# Patient Record
Sex: Female | Born: 1965 | Race: White | Hispanic: No | Marital: Married | Smoking: Never smoker
Health system: Southern US, Community
[De-identification: ages and names within clinical notes are randomized; demographics above are authoritative.]

## PROBLEM LIST (undated history)

## (undated) DIAGNOSIS — F32A Depression, unspecified: Secondary | ICD-10-CM

## (undated) DIAGNOSIS — F329 Major depressive disorder, single episode, unspecified: Secondary | ICD-10-CM

## (undated) DIAGNOSIS — K219 Gastro-esophageal reflux disease without esophagitis: Secondary | ICD-10-CM

## (undated) DIAGNOSIS — N841 Polyp of cervix uteri: Secondary | ICD-10-CM

## (undated) DIAGNOSIS — Z8619 Personal history of other infectious and parasitic diseases: Secondary | ICD-10-CM

## (undated) HISTORY — DX: Gastro-esophageal reflux disease without esophagitis: K21.9

## (undated) HISTORY — DX: Polyp of cervix uteri: N84.1

## (undated) HISTORY — DX: Major depressive disorder, single episode, unspecified: F32.9

## (undated) HISTORY — DX: Depression, unspecified: F32.A

## (undated) HISTORY — DX: Personal history of other infectious and parasitic diseases: Z86.19

---

## 1999-01-16 ENCOUNTER — Inpatient Hospital Stay (HOSPITAL_COMMUNITY): Admission: AD | Admit: 1999-01-16 | Discharge: 1999-01-18 | Payer: Self-pay | Admitting: Obstetrics and Gynecology

## 1999-02-24 ENCOUNTER — Other Ambulatory Visit: Admission: RE | Admit: 1999-02-24 | Discharge: 1999-02-24 | Payer: Self-pay | Admitting: Obstetrics and Gynecology

## 2000-02-28 ENCOUNTER — Other Ambulatory Visit: Admission: RE | Admit: 2000-02-28 | Discharge: 2000-02-28 | Payer: Self-pay | Admitting: Obstetrics and Gynecology

## 2000-03-27 ENCOUNTER — Ambulatory Visit (HOSPITAL_COMMUNITY): Admission: RE | Admit: 2000-03-27 | Discharge: 2000-03-27 | Payer: Self-pay | Admitting: Obstetrics and Gynecology

## 2001-04-25 ENCOUNTER — Other Ambulatory Visit: Admission: RE | Admit: 2001-04-25 | Discharge: 2001-04-25 | Payer: Self-pay | Admitting: Obstetrics and Gynecology

## 2003-02-26 ENCOUNTER — Other Ambulatory Visit: Admission: RE | Admit: 2003-02-26 | Discharge: 2003-02-26 | Payer: Self-pay | Admitting: Obstetrics and Gynecology

## 2004-03-25 ENCOUNTER — Other Ambulatory Visit: Admission: RE | Admit: 2004-03-25 | Discharge: 2004-03-25 | Payer: Self-pay | Admitting: Obstetrics & Gynecology

## 2005-12-19 HISTORY — PX: NASAL SINUS SURGERY: SHX719

## 2006-01-18 ENCOUNTER — Other Ambulatory Visit: Admission: RE | Admit: 2006-01-18 | Discharge: 2006-01-18 | Payer: Self-pay | Admitting: Obstetrics and Gynecology

## 2006-04-28 ENCOUNTER — Ambulatory Visit: Payer: Self-pay | Admitting: Family Medicine

## 2008-12-19 LAB — HM MAMMOGRAPHY

## 2011-08-20 HISTORY — PX: CHOLECYSTECTOMY: SHX55

## 2011-09-04 ENCOUNTER — Observation Stay: Payer: Self-pay | Admitting: Surgery

## 2012-03-15 ENCOUNTER — Encounter: Payer: Self-pay | Admitting: Internal Medicine

## 2012-03-15 ENCOUNTER — Other Ambulatory Visit (HOSPITAL_COMMUNITY)
Admission: RE | Admit: 2012-03-15 | Discharge: 2012-03-15 | Disposition: A | Discharge status: Home / Self Care | Payer: BC Managed Care – PPO | Source: Ambulatory Visit | Attending: Internal Medicine | Admitting: Internal Medicine

## 2012-03-15 ENCOUNTER — Ambulatory Visit (INDEPENDENT_AMBULATORY_CARE_PROVIDER_SITE_OTHER): Payer: BC Managed Care – PPO | Admitting: Internal Medicine

## 2012-03-15 VITALS — BP 110/68 | HR 103 | Temp 98.2°F | Ht 64.0 in | Wt 160.0 lb

## 2012-03-15 DIAGNOSIS — J45909 Unspecified asthma, uncomplicated: Secondary | ICD-10-CM

## 2012-03-15 DIAGNOSIS — J329 Chronic sinusitis, unspecified: Secondary | ICD-10-CM

## 2012-03-15 DIAGNOSIS — Z01419 Encounter for gynecological examination (general) (routine) without abnormal findings: Secondary | ICD-10-CM | POA: Insufficient documentation

## 2012-03-15 DIAGNOSIS — K219 Gastro-esophageal reflux disease without esophagitis: Secondary | ICD-10-CM

## 2012-03-15 DIAGNOSIS — Z0000 Encounter for general adult medical examination without abnormal findings: Secondary | ICD-10-CM

## 2012-03-15 LAB — COMPREHENSIVE METABOLIC PANEL
Alkaline Phosphatase: 77 U/L (ref 39–117)
BUN: 9 mg/dL (ref 6–23)
CO2: 26 mEq/L (ref 19–32)
Creat: 0.69 mg/dL (ref 0.50–1.10)
Glucose, Bld: 90 mg/dL (ref 70–99)
Total Bilirubin: 0.6 mg/dL (ref 0.3–1.2)
Total Protein: 7.3 g/dL (ref 6.0–8.3)

## 2012-03-15 LAB — LIPID PANEL
Cholesterol: 222 mg/dL — ABNORMAL HIGH (ref 0–200)
HDL: 56 mg/dL (ref >39)
LDL Cholesterol: 113 mg/dL — ABNORMAL HIGH (ref 0–99)
Triglycerides: 263 mg/dL — ABNORMAL HIGH (ref <150)
VLDL: 53 mg/dL — ABNORMAL HIGH (ref 0–40)

## 2012-03-15 MED ORDER — SULFAMETHOXAZOLE-TRIMETHOPRIM 800-160 MG PO TABS: 1.0000 | ORAL_TABLET | Freq: Two times a day (BID) | ORAL | Status: AC

## 2012-03-15 MED ORDER — DEXLANSOPRAZOLE 60 MG PO CPDR: 60.0000 mg | DELAYED_RELEASE_CAPSULE | Freq: Every day | ORAL | Status: DC

## 2012-03-15 MED ORDER — ALBUTEROL SULFATE HFA 108 (90 BASE) MCG/ACT IN AERS: 2.0000 | INHALATION_SPRAY | Freq: Four times a day (QID) | RESPIRATORY_TRACT | Status: DC | PRN

## 2012-03-15 NOTE — Progress Notes (Signed)
Subjective:    Patient ID: Tanya Kramer, female    DOB: [birth year], 46 y.o.   MRN: [id number]  HPI 46 year old female with history of asthma and recurrent sinusitis presents to establish care. In regards to her asthma, she reports symptoms are well-controlled with a rare use of albuterol inhaler. She does not use any maintenance inhalers. She denies any current symptoms of cough or shortness of breath.  Regards to recurrent sinusitis, she notes that she previously had sinus surgery and had some improvement in her symptoms, however her symptoms have recently been recurrent. She has frequent episodes of sinusitis, with symptoms including facial pain, headache, purulent nasal drainage. She did complete allergy shots in the past with no improvement. She notes that she was recently treated with a course of Biaxin with no improvement. She denies any fever or chills. She currently has bilateral frontal facial pain, a year pain, and headache.  She also notes a history of GERD. She reports that she has been taking Prilosec with no improvement in her symptoms of upper epigastric pain and reflux. She notes that when she recently was on Biaxin, her symptoms seemed to improve. She has tried modifying her diet including limiting intake of spicy foods such as spaghetti, with no improvement. She denies nausea or vomiting. She denies diarrhea, constipation, blood in her stool. She does note that after having her gallbladder removed, she has urgent bowel movements after some meals.  Outpatient Encounter Prescriptions as of 03/15/2012  Medication Sig Dispense Refill  . albuterol (PROAIR HFA) 108 (90 BASE) MCG/ACT inhaler Inhale 2 puffs into the lungs every 6 (six) hours as needed.  1 Inhaler  3  . cetirizine (ZYRTEC) 10 MG tablet Take 10 mg by mouth daily as needed.      Marland Kitchen OVER THE COUNTER MEDICATION OTC Heartburn Med as needed      . DISCONTD: albuterol (PROAIR HFA) 108 (90 BASE) MCG/ACT inhaler Inhale 2 puffs into  the lungs every 6 (six) hours as needed.      Marland Kitchen dexlansoprazole (DEXILANT) 60 MG capsule Take 1 capsule (60 mg total) by mouth daily.  30 capsule  1  . sulfamethoxazole-trimethoprim (BACTRIM DS,SEPTRA DS) 800-160 MG per tablet Take 1 tablet by mouth 2 (two) times daily.  20 tablet  0    Review of Systems  Constitutional: Negative for fever, chills, appetite change, fatigue and unexpected weight change.  HENT: Positive for congestion and sinus pressure. Negative for ear pain, sore throat, trouble swallowing, neck pain and voice change.   Eyes: Negative for visual disturbance.  Respiratory: Negative for cough, shortness of breath, wheezing and stridor.   Cardiovascular: Negative for chest pain, palpitations and leg swelling.  Gastrointestinal: Positive for abdominal pain. Negative for nausea, vomiting, diarrhea, constipation, blood in stool, abdominal distention and anal bleeding.  Genitourinary: Negative for dysuria and flank pain.  Musculoskeletal: Negative for myalgias, arthralgias and gait problem.  Skin: Negative for color change and rash.  Neurological: Positive for headaches. Negative for dizziness.  Hematological: Negative for adenopathy. Does not bruise/bleed easily.  Psychiatric/Behavioral: Negative for suicidal ideas, sleep disturbance and dysphoric mood. The patient is not nervous/anxious.    BP 110/68  Pulse 103  Temp(Src) 98.2 F (36.8 C) (Oral)  Ht 5\' 4"  (1.626 m)  Wt 160 lb (72.576 kg)  BMI 27.46 kg/m2  SpO2 97%  LMP 03/05/2012     Objective:   Physical Exam  Constitutional: She is oriented to person, place, and time. She appears well-developed and  well-nourished. No distress.  HENT:  Head: Normocephalic and atraumatic.  Right Ear: External ear normal.  Left Ear: External ear normal.  Nose: Nose normal.  Mouth/Throat: Oropharynx is clear and moist. No oropharyngeal exudate.  Eyes: Conjunctivae are normal. Pupils are equal, round, and reactive to light. Right eye  exhibits no discharge. Left eye exhibits no discharge. No scleral icterus.  Neck: Normal range of motion. Neck supple. No tracheal deviation present. No thyromegaly present.  Cardiovascular: Normal rate, regular rhythm, normal heart sounds and intact distal pulses.  Exam reveals no gallop and no friction rub.   No murmur heard. Pulmonary/Chest: Effort normal and breath sounds normal. No respiratory distress. She has no wheezes. She has no rales. She exhibits no tenderness.  Abdominal: Soft. Bowel sounds are normal. She exhibits no distension and no mass. There is no tenderness. There is no rebound and no guarding.  Genitourinary: Rectum normal, vagina normal and uterus normal. No breast swelling, tenderness, discharge or bleeding. Pelvic exam was performed with patient prone. There is no rash, tenderness or lesion on the right labia. There is no rash, tenderness or lesion on the left labia. Uterus is not enlarged and not tender. Cervix exhibits no motion tenderness, no discharge and no friability. Right adnexum displays no mass, no tenderness and no fullness. Left adnexum displays no mass, no tenderness and no fullness. No erythema or tenderness around the vagina. No vaginal discharge found.  Musculoskeletal: Normal range of motion. She exhibits no edema and no tenderness.  Lymphadenopathy:    She has no cervical adenopathy.  Neurological: She is alert and oriented to person, place, and time. No cranial nerve deficit. She exhibits normal muscle tone. Coordination normal.  Skin: Skin is warm and dry. No rash noted. She is not diaphoretic. No erythema. No pallor.  Psychiatric: She has a normal mood and affect. Her behavior is normal. Judgment and thought content normal.          Assessment & Plan:

## 2012-03-15 NOTE — Assessment & Plan Note (Signed)
Symptoms poorly controlled with prilosec. Will change to dexilant. Will check for H. Pylori infection. Follow up 1 month.

## 2012-03-15 NOTE — Assessment & Plan Note (Signed)
Symptoms and exam c/w sinusitis. Will treat with augmentin. If no improvement, consider ENT referral as symptoms persistent and frequently recurrent.

## 2012-03-15 NOTE — Assessment & Plan Note (Signed)
Well controlled with prn albuterol.  Will continue.

## 2012-03-15 NOTE — Assessment & Plan Note (Signed)
Exam including breast and pelvic exam normal today. PAP pending. Mammogram ordered. Will check labs including CBC, CMP, lipids. Will request previous records.

## 2012-03-19 ENCOUNTER — Ambulatory Visit: Payer: Self-pay | Admitting: Internal Medicine

## 2012-03-23 ENCOUNTER — Telehealth: Payer: Self-pay | Admitting: *Deleted

## 2012-03-23 NOTE — Telephone Encounter (Signed)
Message copied by Vernie Murders on Fri Mar 23, 2012  5:08 PM ------      Message from: Ronna Polio A      Created: Fri Mar 23, 2012  3:43 PM       PAP was normal.

## 2012-04-12 ENCOUNTER — Encounter: Payer: Self-pay | Admitting: Internal Medicine

## 2012-04-16 ENCOUNTER — Ambulatory Visit (INDEPENDENT_AMBULATORY_CARE_PROVIDER_SITE_OTHER): Payer: BC Managed Care – PPO | Admitting: Internal Medicine

## 2012-04-16 ENCOUNTER — Encounter: Payer: Self-pay | Admitting: Internal Medicine

## 2012-04-16 VITALS — BP 100/62 | HR 68 | Temp 98.2°F | Resp 14 | Wt 159.5 lb

## 2012-04-16 DIAGNOSIS — K219 Gastro-esophageal reflux disease without esophagitis: Secondary | ICD-10-CM

## 2012-04-16 DIAGNOSIS — J329 Chronic sinusitis, unspecified: Secondary | ICD-10-CM

## 2012-04-16 DIAGNOSIS — J0100 Acute maxillary sinusitis, unspecified: Secondary | ICD-10-CM

## 2012-04-16 MED ORDER — DEXLANSOPRAZOLE 60 MG PO CPDR: 60.0000 mg | DELAYED_RELEASE_CAPSULE | Freq: Every day | ORAL | Status: DC

## 2012-04-16 MED ORDER — AMOXICILLIN-POT CLAVULANATE 875-125 MG PO TABS: 1.0000 | ORAL_TABLET | Freq: Two times a day (BID) | ORAL | Status: AC

## 2012-04-16 NOTE — Assessment & Plan Note (Signed)
Symptoms much improved with Dexilant. Will continue Dexilant. Samples and new Rx given today.

## 2012-04-16 NOTE — Assessment & Plan Note (Signed)
Recurrent. Will treat with Augmentin. Discussed using prednisone as well to help with inflammation, but pt would prefer to avoid using this medication. Will set up with ENT given that symptoms have been recurrent. Question if she may have obstruction leading to chronic infection.

## 2012-04-16 NOTE — Progress Notes (Signed)
Subjective:    Patient ID: Tanya Kramer, female    DOB: 11/26/1966, 46 y.o.   MRN: [id number]  HPI 46 year old female with history of recurrent sinusitis and GERD presents for followup. In regards to her sinus infections, she reports improvement after her last visit with the use of Bactrim. However, symptoms recurred when she stopped antibiotic. She now has had several weeks of nasal congestion, sinus pressure, headache, dry cough, and fatigue. She has tried both over-the-counter antihistamines and inhaled nasal steroids with no improvement.  In regards to her GERD, she reports significant improvement with the use of Dexilant.  Her symptoms recurred when she tried to stop this medication. Notably, her labs were negative for H. pylori.  Outpatient Encounter Prescriptions as of 04/16/2012  Medication Sig Dispense Refill  . albuterol (PROAIR HFA) 108 (90 BASE) MCG/ACT inhaler Inhale 2 puffs into the lungs every 6 (six) hours as needed.  1 Inhaler  3  . cetirizine (ZYRTEC) 10 MG tablet Take 10 mg by mouth daily as needed.      Marland Kitchen OVER THE COUNTER MEDICATION OTC Heartburn Med as needed      . amoxicillin-clavulanate (AUGMENTIN) 875-125 MG per tablet Take 1 tablet by mouth 2 (two) times daily.  20 tablet  0  . dexlansoprazole (DEXILANT) 60 MG capsule Take 1 capsule (60 mg total) by mouth daily.  30 capsule  6   BP 100/62  Pulse 68  Temp(Src) 98.2 F (36.8 C) (Oral)  Resp 14  Wt 159 lb 8 oz (72.349 kg)  LMP 04/07/2012  Review of Systems  Constitutional: Negative for fever, chills, appetite change, fatigue and unexpected weight change.  HENT: Positive for congestion, postnasal drip and sinus pressure. Negative for ear pain, sore throat, trouble swallowing, neck pain and voice change.   Eyes: Negative for visual disturbance.  Respiratory: Positive for cough. Negative for shortness of breath, wheezing and stridor.   Cardiovascular: Negative for chest pain, palpitations and leg swelling.    Gastrointestinal: Negative for nausea, vomiting, abdominal pain, diarrhea, constipation, blood in stool, abdominal distention and anal bleeding.  Genitourinary: Negative for dysuria and flank pain.  Musculoskeletal: Negative for myalgias, arthralgias and gait problem.  Skin: Negative for color change and rash.  Neurological: Positive for headaches. Negative for dizziness.  Hematological: Negative for adenopathy. Does not bruise/bleed easily.  Psychiatric/Behavioral: Negative for suicidal ideas, sleep disturbance and dysphoric mood. The patient is not nervous/anxious.        Objective:   Physical Exam  Constitutional: She is oriented to person, place, and time. She appears well-developed and well-nourished. No distress.  HENT:  Head: Normocephalic and atraumatic.  Right Ear: External ear normal. Tympanic membrane is not erythematous. A middle ear effusion is present.  Left Ear: External ear normal. Tympanic membrane is not erythematous. A middle ear effusion is present.  Nose: Mucosal edema present. Right sinus exhibits maxillary sinus tenderness and frontal sinus tenderness. Left sinus exhibits maxillary sinus tenderness and frontal sinus tenderness.  Mouth/Throat: Posterior oropharyngeal erythema present. No oropharyngeal exudate.  Eyes: Conjunctivae are normal. Pupils are equal, round, and reactive to light. Right eye exhibits no discharge. Left eye exhibits no discharge. No scleral icterus.  Neck: Normal range of motion. Neck supple. No tracheal deviation present. No thyromegaly present.  Cardiovascular: Normal rate, regular rhythm, normal heart sounds and intact distal pulses.  Exam reveals no gallop and no friction rub.   No murmur heard. Pulmonary/Chest: Effort normal and breath sounds normal. No respiratory distress. She has  no wheezes. She has no rales. She exhibits no tenderness.  Musculoskeletal: Normal range of motion. She exhibits no edema and no tenderness.  Lymphadenopathy:     She has no cervical adenopathy.  Neurological: She is alert and oriented to person, place, and time. No cranial nerve deficit. She exhibits normal muscle tone. Coordination normal.  Skin: Skin is warm and dry. No rash noted. She is not diaphoretic. No erythema. No pallor.  Psychiatric: She has a normal mood and affect. Her behavior is normal. Judgment and thought content normal.          Assessment & Plan:

## 2012-04-20 ENCOUNTER — Telehealth: Payer: Self-pay | Admitting: *Deleted

## 2012-04-20 MED ORDER — PREDNISONE (PAK) 10 MG PO TABS: ORAL_TABLET | ORAL | Status: DC

## 2012-04-20 NOTE — Telephone Encounter (Signed)
Patient was seen in office 04.29.13 and was advised to call for Prednisone if sinuses not better; requested/SLS Per VO, JAW Ok to order Prednisone dose pack #21; patient informed.

## 2012-04-23 ENCOUNTER — Other Ambulatory Visit: Payer: Self-pay | Admitting: *Deleted

## 2012-04-23 NOTE — Telephone Encounter (Signed)
Prior Authorization requested for Dexilant; Processed online through [email or web address]. Coverage has been approved from 04.15.13 - 05.06.14 Phoned pharmacy to inform, approval has not been received in their system; LMOM to inform patient to check w/pharmacy later today/SLS

## 2012-04-27 ENCOUNTER — Telehealth: Payer: Self-pay | Admitting: *Deleted

## 2012-04-27 NOTE — Telephone Encounter (Signed)
Received PA for Dexilant.  Approved from 04/02/2012 through 04/23/2013.  Patient and pharmacy notified.  Form sent to be scanned.

## 2012-07-20 ENCOUNTER — Encounter: Payer: Self-pay | Admitting: Internal Medicine

## 2012-07-20 ENCOUNTER — Ambulatory Visit (INDEPENDENT_AMBULATORY_CARE_PROVIDER_SITE_OTHER): Payer: BC Managed Care – PPO | Admitting: Internal Medicine

## 2012-07-20 ENCOUNTER — Telehealth: Payer: Self-pay | Admitting: Internal Medicine

## 2012-07-20 VITALS — BP 120/76 | HR 88 | Temp 98.0°F | Resp 16 | Wt 166.8 lb

## 2012-07-20 DIAGNOSIS — K219 Gastro-esophageal reflux disease without esophagitis: Secondary | ICD-10-CM

## 2012-07-20 MED ORDER — SUCRALFATE 1 G PO TABS: 1.0000 g | ORAL_TABLET | Freq: Four times a day (QID) | ORAL | Status: DC

## 2012-07-20 MED ORDER — RANITIDINE HCL 300 MG PO TABS: 300.0000 mg | ORAL_TABLET | Freq: Every day | ORAL | Status: DC

## 2012-07-20 NOTE — Progress Notes (Signed)
Subjective:    Patient ID: Tanya Kramer, female    DOB: Aug 30, 1966, 46 y.o.   MRN: [id number]  HPI 46 year old female with history of GERD presents for followup. She reports that recently her symptoms are much worse with epigastric abdominal pain and nausea both at night and in the morning. She reports she has been taking her Dexilant on a regular basis. She notes that she was put on a prolonged course of prednisone by her ENT physician for sinusitis. Symptoms seem to be worsened after this. She denies any vomiting, change in bowel habits. She does have some chronic issues with diarrhea ever since having a cholecystectomy.  Outpatient Encounter Prescriptions as of 07/20/2012  Medication Sig Dispense Refill  . albuterol (PROAIR HFA) 108 (90 BASE) MCG/ACT inhaler Inhale 2 puffs into the lungs every 6 (six) hours as needed.  1 Inhaler  3  . azelastine (ASTELIN) 137 MCG/SPRAY nasal spray Place 1 spray into the nose 2 (two) times daily. Use in each nostril as directed      . Beclomethasone Dipropionate (QNASL) 80 MCG/ACT AERS Place into the nose.      . cetirizine (ZYRTEC) 10 MG tablet Take 10 mg by mouth daily as needed.      Marland Kitchen dexlansoprazole (DEXILANT) 60 MG capsule Take 1 capsule (60 mg total) by mouth daily.  30 capsule  6  . montelukast (SINGULAIR) 10 MG tablet Take 10 mg by mouth at bedtime.      . ranitidine (ZANTAC) 300 MG tablet Take 1 tablet (300 mg total) by mouth at bedtime.  30 tablet  6  . sucralfate (CARAFATE) 1 G tablet Take 1 tablet (1 g total) by mouth 4 (four) times daily.  120 tablet  3  . DISCONTD: OVER THE COUNTER MEDICATION OTC Heartburn Med as needed      . DISCONTD: predniSONE (STERAPRED UNI-PAK) 10 MG tablet Take as Directed daily for Dose Pak.  21 tablet  o   BP 120/76  Pulse 88  Temp 98 F (36.7 C) (Oral)  Resp 16  Wt 166 lb 12 oz (75.637 kg)  SpO2 95%  LMP 07/05/2012  Review of Systems  Constitutional: Negative for fever, chills, appetite change, fatigue and  unexpected weight change.  HENT: Negative for ear pain, congestion, sore throat, trouble swallowing, neck pain, voice change and sinus pressure.   Eyes: Negative for visual disturbance.  Respiratory: Negative for cough, shortness of breath, wheezing and stridor.   Cardiovascular: Negative for chest pain, palpitations and leg swelling.  Gastrointestinal: Positive for nausea, abdominal pain and diarrhea. Negative for vomiting, constipation, blood in stool, abdominal distention and anal bleeding.  Genitourinary: Negative for dysuria and flank pain.  Musculoskeletal: Negative for myalgias, arthralgias and gait problem.  Skin: Negative for color change and rash.  Neurological: Negative for dizziness and headaches.  Hematological: Negative for adenopathy. Does not bruise/bleed easily.  Psychiatric/Behavioral: Negative for suicidal ideas, disturbed wake/sleep cycle and dysphoric mood. The patient is not nervous/anxious.        Objective:   Physical Exam  Constitutional: She is oriented to person, place, and time. She appears well-developed and well-nourished. No distress.  HENT:  Head: Normocephalic and atraumatic.  Right Ear: External ear normal.  Left Ear: External ear normal.  Nose: Nose normal.  Mouth/Throat: Oropharynx is clear and moist. No oropharyngeal exudate.  Eyes: Conjunctivae are normal. Pupils are equal, round, and reactive to light. Right eye exhibits no discharge. Left eye exhibits no discharge. No scleral icterus.  Neck: Normal range of motion. Neck supple. No tracheal deviation present. No thyromegaly present.  Cardiovascular: Normal rate, regular rhythm, normal heart sounds and intact distal pulses.  Exam reveals no gallop and no friction rub.   No murmur heard. Pulmonary/Chest: Effort normal and breath sounds normal. No respiratory distress. She has no wheezes. She has no rales. She exhibits no tenderness.  Abdominal: Soft. Bowel sounds are normal. She exhibits no distension  and no mass. There is tenderness in the right upper quadrant and epigastric area. There is no rebound and no guarding.  Musculoskeletal: Normal range of motion. She exhibits no edema and no tenderness.  Lymphadenopathy:    She has no cervical adenopathy.  Neurological: She is alert and oriented to person, place, and time. No cranial nerve deficit. She exhibits normal muscle tone. Coordination normal.  Skin: Skin is warm and dry. No rash noted. She is not diaphoretic. No erythema. No pallor.  Psychiatric: She has a normal mood and affect. Her behavior is normal. Judgment and thought content normal.          Assessment & Plan:

## 2012-07-20 NOTE — Telephone Encounter (Signed)
Informed Tanya Kramer at Dr Kathleene Hazel ofc Dr Rhea Belton is on vacation next week followed by a week at the hospital. I can call next week when the mid level appt's come out and call her back; Erie Noe stated understanding.

## 2012-07-20 NOTE — Telephone Encounter (Signed)
Notified Erie Noe of appt with Willette Cluster, NP; Erie Noe will inform the pt.

## 2012-07-20 NOTE — Assessment & Plan Note (Signed)
Symptoms recently worsening, likely secondary to prolonged use of prednisone. Symptoms not improved with use of PPI. Will add Zantac and sucralfate. H Pylori testing negative in 02/2012. Will set up GI evaluation for upper endoscopy.

## 2012-07-21 LAB — HM PAP SMEAR: HM PAP: NORMAL

## 2012-07-21 LAB — HM MAMMOGRAPHY: HM MAMMO: NORMAL

## 2012-07-25 ENCOUNTER — Encounter: Payer: Self-pay | Admitting: Nurse Practitioner

## 2012-07-25 ENCOUNTER — Ambulatory Visit (INDEPENDENT_AMBULATORY_CARE_PROVIDER_SITE_OTHER): Payer: BC Managed Care – PPO | Admitting: Nurse Practitioner

## 2012-07-25 VITALS — BP 100/68 | HR 80 | Ht 64.0 in | Wt 165.8 lb

## 2012-07-25 DIAGNOSIS — K219 Gastro-esophageal reflux disease without esophagitis: Secondary | ICD-10-CM

## 2012-07-25 NOTE — Progress Notes (Signed)
07/25/2012 Tanya Kramer [id number] 1966/06/24   HISTORY OF PRESENT ILLNESS: Patient is a 46 year old female here for evaluation of GERD. She gives a 3-4 year history of GERD characterized by throat burning. Symptoms worse after meals and at night when lying flat. Until March of this year patient took OTC meds for GERD. In March she was started on daily Dexilant. She has has tried taking the Dexilant in the morning, it did not help. She switched to taking it at bedtime, that hasn't helped either. She has avoided spicy foods, elevated the head of her bed but nothing has really helped. No dysphasia. No unusual weight loss. She has problems with recurrent sinus infections, ENT has treated her with a prolonged course of prednisone. The ENT did not do a laryngoscopy but did mention possibility of GERD contributing to recurrent sinus infections.   Patient also gives a two-month history of nausea relieved with eating. Denies chance for pregnancy. She takes occasional Advil but no excessive use of any NSAIDs. Patient had a cholecystectomy for symptomatic cholelithiasis in September. She's had postprandial loose stool since then. She has a grandfather with a history of colon cancer in his sixties.   Past Medical History  Diagnosis Date  . Asthma   . History of chicken pox   . Depression   . GERD (gastroesophageal reflux disease)   . Allergic rhinitis    Past Surgical History  Procedure Date  . Cholecystectomy 08/2011    Dr Excell Seltzer  . Nasal sinus surgery 2007    reports that she has never smoked. She has never used smokeless tobacco. She reports that she drinks alcohol. She reports that she does not use illicit drugs. family history includes Arthritis in her father; Asthma in her brother; Cancer in her maternal grandfather and maternal uncle; Depression in her maternal aunt; Diabetes in her paternal aunt, paternal grandmother, and paternal uncle; Hyperlipidemia in her brother and mother; Rheum arthritis  in her father; and Stroke in her maternal grandmother. No Known Allergies    Outpatient Encounter Prescriptions as of 07/25/2012  Medication Sig Dispense Refill  . albuterol (PROAIR HFA) 108 (90 BASE) MCG/ACT inhaler Inhale 2 puffs into the lungs every 6 (six) hours as needed.  1 Inhaler  3  . azelastine (ASTELIN) 137 MCG/SPRAY nasal spray Place 1 spray into the nose 2 (two) times daily. Use in each nostril as directed      . Beclomethasone Dipropionate (QNASL) 80 MCG/ACT AERS Place into the nose.      . cetirizine (ZYRTEC) 10 MG tablet Take 10 mg by mouth daily as needed.      Marland Kitchen dexlansoprazole (DEXILANT) 60 MG capsule Take 1 capsule (60 mg total) by mouth daily.  30 capsule  6  . montelukast (SINGULAIR) 10 MG tablet Take 10 mg by mouth at bedtime.      . ranitidine (ZANTAC) 300 MG tablet Take 1 tablet (300 mg total) by mouth at bedtime.  30 tablet  6  . sucralfate (CARAFATE) 1 G tablet Take 1 tablet (1 g total) by mouth 4 (four) times daily.  120 tablet  3     REVIEW OF SYSTEMS  : Positive for allergy/sinus trouble. Positive for cough. All other systems reviewed and negative except where noted in the History of Present Illness.   PHYSICAL EXAM: BP 100/68  Pulse 80  Ht 5\' 4"  (1.626 m)  Wt 165 lb 12.8 oz (75.206 kg)  BMI 28.46 kg/m2  LMP 07/05/2012 General: Well developed white female  in no acute distress Head: Normocephalic and atraumatic Eyes:  sclerae anicteric,conjunctive pink. Ears: Normal auditory acuity Neck: Supple, no masses.  Lungs: Clear throughout to auscultation Heart: Regular rate and rhythm; no murmurs heard Abdomen: Soft, non distended, nontender. No masses or hepatomegaly noted. Normal Bowel sounds Musculoskeletal: Symmetrical with no gross deformities  Skin: No lesions on visible extremities Extremities: No edema or deformities noted Neurological: Alert oriented x 4, grossly nonfocal Cervical Nodes:  No significant cervical adenopathy Psychological:  Alert and  cooperative. Normal mood and affect  ASSESSMENT AND PLAN:  33. 46 year old female with 3 4 year history of GERD. Symptoms refractory to daily PPI and anti-reflux measures. For further evaluation patient will be scheduled for an upper endoscopy.The benefits, risks, and potential complications of EGD with possible biopsies were discussed with the patient and she agrees to proceed. GERD literature given.  2. postprandial loose stool since cholecystectomy in September. 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 Offered trial of cholestyramine, patient declines for now.   3. family history of colon cancer in grandfather ( in his sixties at the time of diagnosis). Patient has no alarm features of colon cancer.

## 2012-07-25 NOTE — Patient Instructions (Addendum)
Continue the Dexilant for now. We have given you Gerd ( Gastroesophageal Reflux Disease)  Literature. We have cancelled the appointment with Dr Rhea Belton.

## 2012-07-27 NOTE — Progress Notes (Signed)
Reviewed and agree with management. Kari Montero D. Ibrahem Volkman, M.D., FACG  

## 2012-08-21 ENCOUNTER — Other Ambulatory Visit: Payer: Self-pay | Admitting: Gastroenterology

## 2012-08-21 ENCOUNTER — Ambulatory Visit (AMBULATORY_SURGERY_CENTER): Payer: BC Managed Care – PPO | Admitting: Gastroenterology

## 2012-08-21 ENCOUNTER — Encounter: Payer: Self-pay | Admitting: Gastroenterology

## 2012-08-21 ENCOUNTER — Ambulatory Visit: Payer: BC Managed Care – PPO | Admitting: Internal Medicine

## 2012-08-21 VITALS — BP 116/65 | HR 16 | Temp 95.8°F | Resp 84 | Ht 64.0 in | Wt 165.0 lb

## 2012-08-21 DIAGNOSIS — K219 Gastro-esophageal reflux disease without esophagitis: Secondary | ICD-10-CM

## 2012-08-21 DIAGNOSIS — R109 Unspecified abdominal pain: Secondary | ICD-10-CM

## 2012-08-21 MED ORDER — OMEPRAZOLE-SODIUM BICARBONATE 40-1100 MG PO CAPS: ORAL_CAPSULE | ORAL | Status: DC

## 2012-08-21 MED ORDER — SODIUM CHLORIDE 0.9 % IV SOLN: 500.0000 mL | INTRAVENOUS | Status: DC

## 2012-08-21 NOTE — Patient Instructions (Addendum)
Gastroesophageal Reflux Disease, Adult Gastroesophageal reflux disease (GERD) happens when acid from your stomach flows up into the esophagus. When acid comes in contact with the esophagus, the acid causes soreness (inflammation) in the esophagus. Over time, GERD may create small holes (ulcers) in the lining of the esophagus. CAUSES   Increased body weight. This puts pressure on the stomach, making acid rise from the stomach into the esophagus.   Smoking. This increases acid production in the stomach.   Drinking alcohol. This causes decreased pressure in the lower esophageal sphincter (valve or ring of muscle between the esophagus and stomach), allowing acid from the stomach into the esophagus.   Late evening meals and a full stomach. This increases pressure and acid production in the stomach.   A malformed lower esophageal sphincter.  Sometimes, no cause is found. SYMPTOMS   Burning pain in the lower part of the mid-chest behind the breastbone and in the mid-stomach area. This may occur twice a week or more often.   Trouble swallowing.   Sore throat.   Dry cough.   Asthma-like symptoms including chest tightness, shortness of breath, or wheezing.  DIAGNOSIS  Your caregiver may be able to diagnose GERD based on your symptoms. In some cases, X-rays and other tests may be done to check for complications or to check the condition of your stomach and esophagus. TREATMENT  Your caregiver may recommend over-the-counter or prescription medicines to help decrease acid production. Ask your caregiver before starting or adding any new medicines.  HOME CARE INSTRUCTIONS   Change the factors that you can control. Ask your caregiver for guidance concerning weight loss, quitting smoking, and alcohol consumption.   Avoid foods and drinks that make your symptoms worse, such as:   Caffeine or alcoholic drinks.   Chocolate.   Peppermint or mint flavorings.   Garlic and onions.   Spicy foods.     Citrus fruits, such as oranges, lemons, or limes.   Tomato-based foods such as sauce, chili, salsa, and pizza.   Fried and fatty foods.   Avoid lying down for the 3 hours prior to your bedtime or prior to taking a nap.   Eat small, frequent meals instead of large meals.   Wear loose-fitting clothing. Do not wear anything tight around your waist that causes pressure on your stomach.   Raise the head of your bed 6 to 8 inches with wood blocks to help you sleep. Extra pillows will not help.   Only take over-the-counter or prescription medicines for pain, discomfort, or fever as directed by your caregiver.   Do not take aspirin, ibuprofen, or other nonsteroidal anti-inflammatory drugs (NSAIDs).  SEEK IMMEDIATE MEDICAL CARE IF:   You have pain in your arms, neck, jaw, teeth, or back.   Your pain increases or changes in intensity or duration.   You develop nausea, vomiting, or sweating (diaphoresis).   You develop shortness of breath, or you faint.   Your vomit is green, yellow, black, or looks like coffee grounds or blood.   Your stool is red, bloody, or black.  These symptoms could be signs of other problems, such as heart disease, gastric bleeding, or esophageal bleeding. MAKE SURE YOU:   Understand these instructions.   Will watch your condition.   Will get help right away if you are not doing well or get worse.  Document Released: 09/14/2005 Document Revised: 11/24/2011 Document Reviewed: 06/24/2011 [facility] Patient Information 2012 [place], [place]. Discharge instructions given with verbal understanding. Office will arrange  for a Gastric Emptying Scan. Resume previous medications. YOU HAD AN ENDOSCOPIC PROCEDURE TODAY AT THE [facility]: Refer to the procedure report that was given to you for any specific questions about what was found during the examination.  If the procedure report does not answer your questions, please call your gastroenterologist to  clarify.  If you requested that your care partner not be given the details of your procedure findings, then the procedure report has been included in a sealed envelope for you to review at your convenience later.  YOU SHOULD EXPECT: Some feelings of bloating in the abdomen. Passage of more gas than usual.  Walking can help get rid of the air that was put into your GI tract during the procedure and reduce the bloating. If you had a lower endoscopy (such as a colonoscopy or flexible sigmoidoscopy) you may notice spotting of blood in your stool or on the toilet paper. If you underwent a bowel prep for your procedure, then you may not have a normal bowel movement for a few days.  DIET: Your first meal following the procedure should be a light meal and then it is ok to progress to your normal diet.  A half-sandwich or bowl of soup is an example of a good first meal.  Heavy or fried foods are harder to digest and may make you feel nauseous or bloated.  Likewise meals heavy in dairy and vegetables can cause extra gas to form and this can also increase the bloating.  Drink plenty of fluids but you should avoid alcoholic beverages for 24 hours.  ACTIVITY: Your care partner should take you home directly after the procedure.  You should plan to take it easy, moving slowly for the rest of the day.  You can resume normal activity the day after the procedure however you should NOT DRIVE or use heavy machinery for 24 hours (because of the sedation medicines used during the test).    SYMPTOMS TO REPORT IMMEDIATELY: A gastroenterologist can be reached at any hour.  During normal business hours, 8:30 AM to 5:00 PM Monday through Friday, call [phone].  After hours and on weekends, please call the GI answering service at [phone] who will take a message and have the physician on call contact you.   Following upper endoscopy (EGD)  Vomiting of blood or coffee ground material  New chest pain or pain under the  shoulder blades  Painful or persistently difficult swallowing  New shortness of breath  Fever of 100F or higher  Black, tarry-looking stools  FOLLOW UP: If any biopsies were taken you will be contacted by phone or by letter within the next 1-3 weeks.  Call your gastroenterologist if you have not heard about the biopsies in 3 weeks.  Our staff will call the home number listed on your records the next business day following your procedure to check on you and address any questions or concerns that you may have at that time regarding the information given to you following your procedure. This is a courtesy call and so if there is no answer at the home number and we have not heard from you through the emergency physician on call, we will assume that you have returned to your regular daily activities without incident.  SIGNATURES/CONFIDENTIALITY: You and/or your care partner have signed paperwork which will be entered into your electronic medical record.  These signatures attest to the fact that that the information above on your After Visit Summary  has been reviewed and is understood.  Full responsibility of the confidentiality of this discharge information lies with you and/or your care-partner.  

## 2012-08-21 NOTE — Progress Notes (Signed)
Patient did not experience any of the following events: a burn prior to discharge; a fall within the facility; wrong site/side/patient/procedure/implant event; or a hospital transfer or hospital admission upon discharge from the facility. (G8907) Patient did not have preoperative order for IV antibiotic SSI prophylaxis. (G8918)  

## 2012-08-21 NOTE — Op Note (Signed)
[facility] [address] [place], [address]   ENDOSCOPY PROCEDURE REPORT  PATIENT: Tanya Kramer, Tanya Kramer  MR#: [id number] BIRTHDATE: 10/22/1966 , 46  yrs. old GENDER: Female ENDOSCOPIST: Louis Meckel, MD REFERRED BY:  Ronna Polio, M.D. PROCEDURE DATE:  08/21/2012 PROCEDURE:  EGD, diagnostic ASA CLASS:     Class II INDICATIONS:  heartburn. MEDICATIONS: MAC sedation, administered by CRNA, propofol (Diprivan) 200mg  IV, and Simethicone 0.6cc PO TOPICAL ANESTHETIC: Cetacaine Spray  DESCRIPTION OF PROCEDURE: After the risks benefits and alternatives of the procedure were thoroughly explained, informed consent was obtained.  The LB [id number]-H180 [id number] endoscope was introduced through the mouth and advanced to the second portion of the duodenum. Without limitations.  The instrument was slowly withdrawn as the mucosa was fully examined.    The upper, middle and distal third of the esophagus were carefully inspected and no abnormalities were noted.  The z-line was well seen at the GEJ.  The endoscope was pushed into the fundus which was normal including a retroflexed view.  The antrum, gastric body, first and second part of the duodenum were unremarkable. Retroflexed views revealed no abnormalities.     The scope was then withdrawn from the patient and the procedure completed.  COMPLICATIONS: There were no complications.  ENDOSCOPIC IMPRESSION: Normal EGD  RECOMMENDATIONS:Trial of zegerid My office will arrange for you to have a Gastric Emptying Scan performed.  This is a radiology test that gives an idea of how well your stomach functions. OV 3 weeks  REPEAT EXAM:  eSigned:  Louis Meckel, MD 08/21/2012 3:56 PM   CC:

## 2012-08-21 NOTE — Progress Notes (Signed)
The pt tolerated the egd very well. Maw   

## 2012-08-22 ENCOUNTER — Telehealth: Payer: Self-pay | Admitting: *Deleted

## 2012-08-22 ENCOUNTER — Telehealth: Payer: Self-pay

## 2012-08-22 NOTE — Telephone Encounter (Signed)
Pt scheduled for GES at [facility] arrival time 7:45am for an 8am appt. Pt to be NPO after midnight. Pt to hold all stomach meds the day prior to scan. Pt aware of appt date and time.

## 2012-08-22 NOTE — Telephone Encounter (Signed)
  Follow up Call-  Call back number 08/21/2012  Post procedure Call Back phone  # [phone]  Permission to leave phone message Yes     Patient questions:  Do you have a fever, pain , or abdominal swelling? no Pain Score  0 *  Have you tolerated food without any problems? yes  Have you been able to return to your normal activities? yes  Do you have any questions about your discharge instructions: Diet   no Medications  no Follow up visit  no  Do you have questions or concerns about your Care? no  Actions: * If pain score is 4 or above: No action needed, pain <4.

## 2012-08-23 ENCOUNTER — Ambulatory Visit: Payer: BC Managed Care – PPO | Admitting: Internal Medicine

## 2012-08-31 ENCOUNTER — Encounter (HOSPITAL_COMMUNITY): Payer: Self-pay

## 2012-08-31 ENCOUNTER — Encounter (HOSPITAL_COMMUNITY)
Admission: RE | Admit: 2012-08-31 | Discharge: 2012-08-31 | Disposition: A | Discharge status: Home / Self Care | Payer: BC Managed Care – PPO | Source: Ambulatory Visit | Attending: Gastroenterology | Admitting: Gastroenterology

## 2012-08-31 DIAGNOSIS — K219 Gastro-esophageal reflux disease without esophagitis: Secondary | ICD-10-CM | POA: Insufficient documentation

## 2012-08-31 DIAGNOSIS — R109 Unspecified abdominal pain: Secondary | ICD-10-CM | POA: Insufficient documentation

## 2012-08-31 DIAGNOSIS — K3189 Other diseases of stomach and duodenum: Secondary | ICD-10-CM | POA: Insufficient documentation

## 2012-08-31 DIAGNOSIS — R1013 Epigastric pain: Secondary | ICD-10-CM | POA: Insufficient documentation

## 2012-08-31 MED ORDER — TECHNETIUM TC 99M SULFUR COLLOID
2.2000 | Freq: Once | INTRAVENOUS | Status: AC | PRN
  Administered 2012-08-31: 2.2 via INTRAVENOUS

## 2012-09-05 ENCOUNTER — Other Ambulatory Visit: Payer: Self-pay | Admitting: Gastroenterology

## 2012-09-05 DIAGNOSIS — K3184 Gastroparesis: Secondary | ICD-10-CM

## 2012-09-05 MED ORDER — METOCLOPRAMIDE HCL 10 MG PO TABS: 10.0000 mg | ORAL_TABLET | Freq: Four times a day (QID) | ORAL | Status: DC

## 2012-09-06 ENCOUNTER — Telehealth: Payer: Self-pay | Admitting: Gastroenterology

## 2012-09-06 MED ORDER — ERYTHROMYCIN BASE 250 MG PO TABS: 250.0000 mg | ORAL_TABLET | Freq: Four times a day (QID) | ORAL | Status: DC

## 2012-09-06 NOTE — Telephone Encounter (Signed)
She can try erythromycin 250mg  qid

## 2012-09-06 NOTE — Telephone Encounter (Signed)
Pt aware and script sent to pharmacy. 

## 2012-09-06 NOTE — Telephone Encounter (Signed)
Pt was afraid to take Reglan due all the possible side effects. Pt wants to know if there is something else she can take. Please advise.

## 2012-09-18 ENCOUNTER — Ambulatory Visit (INDEPENDENT_AMBULATORY_CARE_PROVIDER_SITE_OTHER): Payer: BC Managed Care – PPO | Admitting: Gastroenterology

## 2012-09-18 ENCOUNTER — Ambulatory Visit: Payer: BC Managed Care – PPO | Admitting: Gastroenterology

## 2012-09-18 ENCOUNTER — Encounter: Payer: Self-pay | Admitting: Gastroenterology

## 2012-09-18 VITALS — BP 100/66 | HR 84 | Ht 64.0 in | Wt 166.0 lb

## 2012-09-18 DIAGNOSIS — K3184 Gastroparesis: Secondary | ICD-10-CM

## 2012-09-18 DIAGNOSIS — K219 Gastro-esophageal reflux disease without esophagitis: Secondary | ICD-10-CM

## 2012-09-18 MED ORDER — AMBULATORY NON FORMULARY MEDICATION: Status: DC

## 2012-09-18 NOTE — Assessment & Plan Note (Addendum)
Symptoms clearly are exacerbated by gastroparesis.  Medications #1 continue Zegerid #2 trial of domperidone

## 2012-09-18 NOTE — Patient Instructions (Addendum)
Please follow up in six weeks with Dr. Arlyce Dice  We sent Domperidone to a compound pharmacy in [place]. Please take 10 mg by mouth four times daily 30 min before meals and bedtime..  There phone number is [phone]

## 2012-09-18 NOTE — Assessment & Plan Note (Signed)
Patient is symptomatic from gastroparesis as evidenced by her severe pyrosis and regurgitation. She's had a minor response to erythromycin.  Recommendations #1 trial of domperidone 10 mg one half hour a.c. and at bedtime

## 2012-09-18 NOTE — Progress Notes (Signed)
History of Present Illness:  Tanya Kramer has returned for followup of GERD. Upper endoscopy was unremarkable. Gastric empty scan demonstrated significant gastroparesis. She was afraid to use Reglan after reading about side effects and was placed on erythromycin. She notices only slight improvement in pyrosis and regurgitation on erythromycin.   Review of Systems: Pertinent positive and negative review of systems were noted in the above HPI section. All other review of systems were otherwise negative.    Current Medications, Allergies, Past Medical History, Past Surgical History, Family History and Social History were reviewed in [employer] [facility] electronic medical record  Vital signs were reviewed in today's medical record. Physical Exam: General: Well developed , well nourished, no acute distress

## 2012-11-09 ENCOUNTER — Ambulatory Visit: Payer: BC Managed Care – PPO | Admitting: Gastroenterology

## 2012-11-14 ENCOUNTER — Other Ambulatory Visit: Payer: Self-pay | Admitting: Gastroenterology

## 2012-11-14 MED ORDER — OMEPRAZOLE-SODIUM BICARBONATE 40-1100 MG PO CAPS: ORAL_CAPSULE | ORAL | Status: DC

## 2012-11-14 NOTE — Telephone Encounter (Signed)
SENT PTS MED TODAY

## 2013-02-02 ENCOUNTER — Other Ambulatory Visit: Payer: Self-pay

## 2013-03-18 ENCOUNTER — Ambulatory Visit (INDEPENDENT_AMBULATORY_CARE_PROVIDER_SITE_OTHER): Payer: BC Managed Care – PPO | Admitting: Internal Medicine

## 2013-03-18 ENCOUNTER — Encounter: Payer: Self-pay | Admitting: Internal Medicine

## 2013-03-18 ENCOUNTER — Telehealth: Payer: Self-pay | Admitting: Internal Medicine

## 2013-03-18 VITALS — BP 128/68 | HR 104 | Temp 98.6°F | Resp 18 | Wt 170.0 lb

## 2013-03-18 DIAGNOSIS — K3184 Gastroparesis: Secondary | ICD-10-CM

## 2013-03-18 DIAGNOSIS — R062 Wheezing: Secondary | ICD-10-CM

## 2013-03-18 DIAGNOSIS — J45901 Unspecified asthma with (acute) exacerbation: Secondary | ICD-10-CM | POA: Insufficient documentation

## 2013-03-18 MED ORDER — PREDNISONE (PAK) 10 MG PO TABS: ORAL_TABLET | ORAL | Status: DC

## 2013-03-18 MED ORDER — HYDROCOD POLST-CHLORPHEN POLST 10-8 MG/5ML PO LQCR: 5.0000 mL | Freq: Two times a day (BID) | ORAL | Status: DC | PRN

## 2013-03-18 MED ORDER — METHYLPREDNISOLONE ACETATE 40 MG/ML IJ SUSP
40.0000 mg | Freq: Once | INTRAMUSCULAR | Status: AC
  Administered 2013-03-18: 40 mg via INTRAMUSCULAR

## 2013-03-18 MED ORDER — ALBUTEROL SULFATE (2.5 MG/3ML) 0.083% IN NEBU
2.5000 mg | INHALATION_SOLUTION | Freq: Once | RESPIRATORY_TRACT | Status: AC
  Administered 2013-03-18: 2.5 mg via RESPIRATORY_TRACT

## 2013-03-18 MED ORDER — BUDESONIDE-FORMOTEROL FUMARATE 160-4.5 MCG/ACT IN AERO: 2.0000 | INHALATION_SPRAY | Freq: Two times a day (BID) | RESPIRATORY_TRACT | Status: DC

## 2013-03-18 NOTE — Telephone Encounter (Signed)
Patient was requested GI referral to [facility] during an acute visit with me.  Dr. Dan Humphreys is requesting to see her in follow up before she will make a GI referral bc last OV was August.

## 2013-03-18 NOTE — Assessment & Plan Note (Signed)
Patient was given an albuterol nebulizer treatment and an IM Depo medrol 4 mg dose.  Lung exam was good,  Cough very deep and nonproductive.  Will prescribe prednisone, symbicort and tussionex.

## 2013-03-18 NOTE — Progress Notes (Signed)
Patient ID: Tanya Kramer, female   DOB: 1966/05/15, 47 y.o.   MRN: [id number]  Patient Active Problem List  Diagnosis  . Asthma  . General medical exam  . GERD (gastroesophageal reflux disease)  . Gastroparesis  . Asthma with acute exacerbation    Subjective:  CC:   Chief Complaint  Patient presents with  . Shortness of Breath    HPI:   Tanya Kramer a 48 y.o. female who presents Persistent nonprodugtive  cough and wheezing.  Patient is a 47 yr old white female with a history of mild asthma, no daily symptoms.  Not on any maintenance inhalers.  Current symptoms started on Saturday and were triggered by acid reflux aspiration.  She uses zegerid twice daily tagamet daily but does not take it 30 minutes prior to food.  Using albuterol MDI every 6 hours with no significant improvement yet.   2) History of gastroparesis and chronic abdominal pain . She is requesting referral to a  GI doctor,  Because she was referred to Dr. Rhea Belton but saw Dr. Arlyce Dice for unclear reasons. She did not have a good experience with Dr.  Arlyce Dice and does not want to return to [place].         Past Medical History  Diagnosis Date  . Asthma   . History of chicken pox   . Depression   . GERD (gastroesophageal reflux disease)   . Allergic rhinitis     Past Surgical History  Procedure Laterality Date  . Cholecystectomy  08/2011    Dr Excell Seltzer  . Nasal sinus surgery  2007       The following portions of the patient's history were reviewed and updated as appropriate: Allergies, current medications, and problem list.    Review of Systems:   12 Pt  review of systems was negative except those addressed in the HPI,     History   Social History  . Marital Status: Married    Spouse Name: N/A    Number of Children: 2  . Years of Education: N/A   Occupational History  . Office assistant    Social History Main Topics  . Smoking status: Never Smoker   . Smokeless tobacco: Never Used   . Alcohol Use: Yes     Comment: Occasional  . Drug Use: No  . Sexually Active: Not on file   Other Topics Concern  . Not on file   Social History Narrative   Lives in [place] with husband and 2 children. Works at [employer].      Regular Exercise -  Walk 2 to 3 times a week, 1.5 miles   Daily Caffeine Use:  Diet coke 2 to 3 16oz bottles                 Objective:  BP 128/68  Pulse 104  Temp(Src) 98.6 F (37 C) (Oral)  Resp 18  Wt 170 lb (77.111 kg)  BMI 29.17 kg/m2  SpO2 96%  LMP 02/22/2013  General appearance: alert, cooperative and appears stated age Ears: normal TM's and external ear canals both ears Throat: lips, mucosa, and tongue normal; teeth and gums normal Neck: no adenopathy, no carotid bruit, supple, symmetrical, trachea midline and thyroid not enlarged, symmetric, no tenderness/mass/nodules Back: symmetric, no curvature. ROM normal. No CVA tenderness. Lungs: clear to auscultation bilaterally Heart: regular rate and rhythm, S1, S2 normal, no murmur, click, rub or gallop Abdomen: soft, non-tender; bowel sounds normal; no masses,  no organomegaly  Pulses: 2+ and symmetric Skin: Skin color, texture, turgor normal. No rashes or lesions Lymph nodes: Cervical, supraclavicular, and axillary nodes normal.  Assessment and Plan:  Gastroparesis Patient is requesting a second opinion fron a [place] gastroenterologist.  Per Dr Dan Humphreys, she needs a follow up with her before a referral will be made bc she has not been seen since August by Dr Dan Humphreys. (this was not conveyed to patient before she left office)   Asthma with acute exacerbation Patient was given an albuterol nebulizer treatment and an IM Depo medrol 4 mg dose.  Lung exam was good,  Cough very deep and nonproductive.  Will prescribe prednisone, symbicort and tussionex.    Updated Medication List Outpatient Encounter Prescriptions as of 03/18/2013  Medication Sig Dispense Refill  . albuterol  (PROAIR HFA) 108 (90 BASE) MCG/ACT inhaler Inhale 2 puffs into the lungs every 6 (six) hours as needed.  1 Inhaler  3  . AMBULATORY NON FORMULARY MEDICATION Domperidone 10 mg Please take one by mouth four times daily 30 min before meals and bedtime  120 capsule  3  . azelastine (ASTELIN) 137 MCG/SPRAY nasal spray Place 1 spray into the nose 2 (two) times daily. Use in each nostril as directed      . Beclomethasone Dipropionate (QNASL) 80 MCG/ACT AERS Place into the nose.      . erythromycin (E-MYCIN) 250 MG tablet Take 1 tablet (250 mg total) by mouth 4 (four) times daily.  120 tablet  3  . montelukast (SINGULAIR) 10 MG tablet Take 10 mg by mouth at bedtime.      Marland Kitchen omeprazole-sodium bicarbonate (ZEGERID) 40-1100 MG per capsule Take 1 tab before breakfast and at bedtime  30 capsule  11  . budesonide-formoterol (SYMBICORT) 160-4.5 MCG/ACT inhaler Inhale 2 puffs into the lungs 2 (two) times daily.  1 Inhaler  12  . chlorpheniramine-HYDROcodone (TUSSIONEX PENNKINETIC ER) 10-8 MG/5ML LQCR Take 5 mLs by mouth every 12 (twelve) hours as needed.  200 mL  0  . predniSONE (STERAPRED UNI-PAK) 10 MG tablet 6 tablets daily for 3 days,  Then begine 10 mg daily taper  33 tablet  0  . [DISCONTINUED] cetirizine (ZYRTEC) 10 MG tablet Take 10 mg by mouth daily as needed.      . [EXPIRED] albuterol (PROVENTIL) (2.5 MG/3ML) 0.083% nebulizer solution 2.5 mg       . [EXPIRED] methylPREDNISolone acetate (DEPO-MEDROL) injection 40 mg        No facility-administered encounter medications on file as of 03/18/2013.     No orders of the defined types were placed in this encounter.    No Follow-up on file.

## 2013-03-18 NOTE — Patient Instructions (Addendum)
For your asthma exacerbation:  Symbicort inhaler 2 puffs twice daily for 2 weeks   Prednisone 60 mg daily x 3 days, theen taper by 10 mg daily until gone (8 days total)  Albuterol inhaler as needed for wheezing  tussionex cough medicine to quiet cough

## 2013-03-18 NOTE — Assessment & Plan Note (Signed)
Patient is requesting a second opinion fron a Museum/gallery exhibitions officer.  Per Dr Dan Humphreys, she needs a follow up with her before a referral will be made bc she has not been seen since August by Dr Dan Humphreys. (this was not conveyed to patient before she left office)

## 2013-03-18 NOTE — Telephone Encounter (Signed)
Per Dr. Dan Humphreys it is ok to work this pt in a same day appt space. Please reschedule the appt for 5/7

## 2013-03-18 NOTE — Telephone Encounter (Signed)
Appointment 4/16 pt aware °

## 2013-04-03 ENCOUNTER — Encounter: Payer: Self-pay | Admitting: Internal Medicine

## 2013-04-03 ENCOUNTER — Ambulatory Visit (INDEPENDENT_AMBULATORY_CARE_PROVIDER_SITE_OTHER): Payer: BC Managed Care – PPO | Admitting: Internal Medicine

## 2013-04-03 VITALS — BP 104/70 | HR 91 | Temp 98.6°F | Wt 166.0 lb

## 2013-04-03 DIAGNOSIS — K3184 Gastroparesis: Secondary | ICD-10-CM

## 2013-04-03 DIAGNOSIS — Z0000 Encounter for general adult medical examination without abnormal findings: Secondary | ICD-10-CM

## 2013-04-03 DIAGNOSIS — K219 Gastro-esophageal reflux disease without esophagitis: Secondary | ICD-10-CM

## 2013-04-03 LAB — COMPREHENSIVE METABOLIC PANEL
ALT: 29 U/L (ref 0–35)
BUN: 5 mg/dL — ABNORMAL LOW (ref 6–23)
CO2: 26 mEq/L (ref 19–32)
Calcium: 9.3 mg/dL (ref 8.4–10.5)
Chloride: 106 mEq/L (ref 96–112)
Creatinine, Ser: 0.7 mg/dL (ref 0.4–1.2)
GFR: 93.18 mL/min (ref >60.00)
Glucose, Bld: 111 mg/dL — ABNORMAL HIGH (ref 70–99)
Total Bilirubin: 0.6 mg/dL (ref 0.3–1.2)

## 2013-04-03 LAB — LIPID PANEL
Cholesterol: 218 mg/dL — ABNORMAL HIGH (ref 0–200)
HDL: 43.7 mg/dL (ref >39.00)
VLDL: 108.2 mg/dL — ABNORMAL HIGH (ref 0.0–40.0)

## 2013-04-03 NOTE — Progress Notes (Signed)
Subjective:    Patient ID: Tanya Kramer, female    DOB: [birth year], 47 y.o.   MRN: [id number]  HPI 47 year old female with history of asthma presents for followup. At her last visit, she was treated for asthma exacerbation with antibiotics and prednisone taper. She reports that symptoms have resolved.  She is concerned today about symptoms of nausea and epigastric abdominal pain. She was evaluated by Dr. Arlyce Dice in GI. Upper endoscopy was normal. Gastric emptying study showed gastroparesis. She was tried on Reglan but did not take this medication because of concerns about side effects. She was then tried on erythromycin with no improvement. She describes some frustration the persistence of her symptoms. She is taking Zegerid for reflux symptoms with no improvement.Denies any recent vomiting, weight loss, change in bowel habits.  Outpatient Encounter Prescriptions as of 04/03/2013  Medication Sig Dispense Refill  . albuterol (PROAIR HFA) 108 (90 BASE) MCG/ACT inhaler Inhale 2 puffs into the lungs every 6 (six) hours as needed.  1 Inhaler  3  . Beclomethasone Dipropionate (QNASL) 80 MCG/ACT AERS Place into the nose.      . montelukast (SINGULAIR) 10 MG tablet Take 10 mg by mouth at bedtime.      . ranitidine (ZANTAC) 300 MG tablet Take 300 mg by mouth at bedtime.      . AMBULATORY NON FORMULARY MEDICATION Domperidone 10 mg Please take one by mouth four times daily 30 min before meals and bedtime  120 capsule  3  . azelastine (ASTELIN) 137 MCG/SPRAY nasal spray Place 1 spray into the nose 2 (two) times daily. Use in each nostril as directed      . budesonide-formoterol (SYMBICORT) 160-4.5 MCG/ACT inhaler Inhale 2 puffs into the lungs 2 (two) times daily.  1 Inhaler  12  . omeprazole-sodium bicarbonate (ZEGERID) 40-1100 MG per capsule Take 1 tab before breakfast and at bedtime  30 capsule  11   No facility-administered encounter medications on file as of 04/03/2013.   BP 104/70  Pulse 91   Temp(Src) 98.6 F (37 C) (Oral)  Wt 166 lb (75.297 kg)  BMI 28.48 kg/m2  SpO2 97%  LMP 03/19/2013  Review of Systems  Constitutional: Negative for fever, chills, appetite change, fatigue and unexpected weight change.  HENT: Negative for ear pain, congestion, sore throat, trouble swallowing, neck pain, voice change and sinus pressure.   Eyes: Negative for visual disturbance.  Respiratory: Negative for cough, shortness of breath, wheezing and stridor.   Cardiovascular: Negative for chest pain, palpitations and leg swelling.  Gastrointestinal: Positive for nausea and abdominal pain. Negative for vomiting, diarrhea, constipation, blood in stool, abdominal distention and anal bleeding.  Genitourinary: Negative for dysuria and flank pain.  Musculoskeletal: Negative for myalgias, arthralgias and gait problem.  Skin: Negative for color change and rash.  Neurological: Negative for dizziness and headaches.  Hematological: Negative for adenopathy. Does not bruise/bleed easily.  Psychiatric/Behavioral: Negative for suicidal ideas, sleep disturbance and dysphoric mood. The patient is not nervous/anxious.        Objective:   Physical Exam  Constitutional: She is oriented to person, place, and time. She appears well-developed and well-nourished. No distress.  HENT:  Head: Normocephalic and atraumatic.  Right Ear: External ear normal.  Left Ear: External ear normal.  Nose: Nose normal.  Mouth/Throat: Oropharynx is clear and moist. No oropharyngeal exudate.  Eyes: Conjunctivae are normal. Pupils are equal, round, and reactive to light. Right eye exhibits no discharge. Left eye exhibits no discharge. No scleral icterus.  Neck: Normal range of motion. Neck supple. No tracheal deviation present. No thyromegaly present.  Cardiovascular: Normal rate, regular rhythm, normal heart sounds and intact distal pulses.  Exam reveals no gallop and no friction rub.   No murmur heard. Pulmonary/Chest: Effort  normal and breath sounds normal. No respiratory distress. She has no wheezes. She has no rales. She exhibits no tenderness.  Abdominal: Soft. Bowel sounds are normal. She exhibits no distension and no mass. There is no tenderness. There is no rebound and no guarding.  Musculoskeletal: Normal range of motion. She exhibits no edema and no tenderness.  Lymphadenopathy:    She has no cervical adenopathy.  Neurological: She is alert and oriented to person, place, and time. No cranial nerve deficit. She exhibits normal muscle tone. Coordination normal.  Skin: Skin is warm and dry. No rash noted. She is not diaphoretic. No erythema. No pallor.  Psychiatric: She has a normal mood and affect. Her behavior is normal. Judgment and thought content normal.          Assessment & Plan:

## 2013-04-03 NOTE — Assessment & Plan Note (Addendum)
Symptoms persistent. Pt did not take Reglan because of concern about side effects. No improvement with erythromycin. Minimal improvement in reflux symptoms with Zegrid. Will set up GI referral at [facility]. Question if she might be candidate for Nissen to help with reflux symptoms or compassionate use of propulsid. Will also set up nutrition evaluation to help her with food choices in setting of GERD and gastroparesis.

## 2013-04-03 NOTE — Patient Instructions (Signed)
Gastroparesis   Gastroparesis is also called slowed stomach emptying (delayed gastric emptying). It is a condition in which the stomach takes too long to empty its contents. It often happens in people with diabetes.   CAUSES   Gastroparesis happens when nerves to the stomach are damaged or stop working. When the nerves are damaged, the muscles of the stomach and intestines do not work normally. The movement of food is slowed or stopped. High blood glucose (sugar) causes changes in nerves and can damage the blood vessels that carry oxygen and nutrients to the nerves.  RISK FACTORS  · Diabetes.  · Post-viral syndromes.  · Eating disorders (anorexia, bulimia).  · Surgery on the stomach or vagus nerve.  · Gastroesophageal reflux disease (rarely).  · Smooth muscle disorders (amyloidosis, scleroderma).  · Metabolic disorders, including hypothyroidism.  · Parkinson's disease.  SYMPTOMS   · Heartburn.  · Feeling sick to your stomach (nausea).  · Vomiting of undigested food.  · An early feeling of fullness when eating.  · Weight loss.  · Abdominal bloating.  · Erratic blood glucose levels.  · Lack of appetite.  · Gastroesophageal reflux.  · Spasms of the stomach wall.  Complications can include:  · Bacterial overgrowth in stomach. Food stays in the stomach and can ferment and cause bacteria to grow.  · Weight loss due to difficulty digesting and absorbing nutrients.  · Vomiting.  · Obstruction in the stomach. Undigested food can harden and cause nausea and vomiting.  · Blood glucose fluctuations caused by inconsistent food absorption.  DIAGNOSIS   The diagnosis of gastroparesis is confirmed through one or more of the following tests:  · Barium X-rays and scans. These tests look at how long it takes for food to move through the stomach.  · Gastric manometry. This test measures electrical and muscular activity in the stomach. A thin tube is passed down the throat into the stomach. The tube contains a wire that takes  measurements of the stomach's electrical and muscular activity as it digests liquids and solid food.  · Endoscopy. This procedure is done with a long, thin tube called an endoscope. It is passed through the mouth and gently guides down the esophagus into the stomach. This tube helps the caregiver look at the lining of the stomach to check for any abnormalities.  · Ultrasound. This can rule out gallbladder disease or pancreatitis. This test will outline and define the shape of the gallbladder and pancreas.  TREATMENT   · The primary treatment is to identify the problem and help control blood glucose levels. Treatments include:  · Exercise.  · Medicines to control nausea and vomiting.  · Medicines to stimulate stomach muscles.  · Changes in what and when you eat.  · Having smaller meals more often.  · Eating low-fiber forms of high-fiber foods, such aseating cooked vegetables instead of raw vegetables.  · Eating low-fat foods.  · Consuming liquids, which are easier to digest.  · In severe cases, feeding tubes and intravenous (IV) feeding may be needed.  It is important to note that in most cases, treatment does not cure gastroparesis. It is usually a lasting (chronic) condition. Treatment helps you manage the condition so that you can be as healthy and comfortable as possible.  NEW TREATMENTS  · A gastric neurostimulator has been developed to assist people with gastroparesis. The battery-operated device is surgically implanted. It emits mild electrical pulses to help improve stomach emptying and to control nausea and   vomiting.  · The use of botulinum toxin has been shown to improve stomach emptying by decreasing the prolonged contractions of the muscle between the stomach and the small intestine (pyloric sphincter). The benefits are temporary.  SEEK MEDICAL CARE IF:   · You are having problems keeping your blood glucose in goal range.  · You are having nausea, vomiting, bloating, or early feelings of fullness with  eating.  · Your symptoms do not change with a change in diet.  Document Released: 12/05/2005 Document Revised: 02/27/2012 Document Reviewed: 05/14/2009  [employer]® Patient Information ©2013 [employer].

## 2013-04-03 NOTE — Assessment & Plan Note (Signed)
Symptoms exacerbated by gastroparesis. Will set up GI referral as no improvement with Zegrid. Question if she might benefit from Nissen.

## 2013-04-24 ENCOUNTER — Encounter: Payer: BC Managed Care – PPO | Admitting: Internal Medicine

## 2013-04-25 ENCOUNTER — Ambulatory Visit: Payer: BC Managed Care – PPO | Admitting: Internal Medicine

## 2013-07-01 ENCOUNTER — Ambulatory Visit: Payer: Self-pay | Admitting: Gastroenterology

## 2013-07-11 ENCOUNTER — Encounter: Payer: BC Managed Care – PPO | Admitting: Internal Medicine

## 2013-07-18 ENCOUNTER — Encounter: Payer: Self-pay | Admitting: Internal Medicine

## 2013-10-24 ENCOUNTER — Other Ambulatory Visit: Payer: Self-pay

## 2014-04-05 LAB — HEPATIC FUNCTION PANEL
ALT: 24 U/L (ref 7–35)
AST: 23 U/L (ref 13–35)
Alkaline Phosphatase: 87 U/L (ref 25–125)
Bilirubin, Total: 0.2 mg/dL

## 2014-04-05 LAB — BASIC METABOLIC PANEL
BUN: 8 mg/dL (ref 4–21)
CREATININE: 0.8 mg/dL (ref <=1.1)
Glucose: 97 mg/dL
POTASSIUM: 4.2 mmol/L (ref 3.4–5.3)
SODIUM: 139 mmol/L (ref 137–147)

## 2014-04-05 LAB — HEMOGLOBIN A1C: HEMOGLOBIN A1C: 5.4 % (ref 4.0–6.0)

## 2014-04-05 LAB — LIPID PANEL
Cholesterol: 214 mg/dL — AB (ref 0–200)
HDL: 39 mg/dL (ref 35–70)
TRIGLYCERIDES: 423 mg/dL — AB (ref 40–160)

## 2014-07-21 ENCOUNTER — Encounter: Payer: Self-pay | Admitting: *Deleted

## 2014-07-21 ENCOUNTER — Ambulatory Visit (INDEPENDENT_AMBULATORY_CARE_PROVIDER_SITE_OTHER): Payer: BC Managed Care – PPO | Admitting: Internal Medicine

## 2014-07-21 ENCOUNTER — Encounter: Payer: Self-pay | Admitting: Internal Medicine

## 2014-07-21 VITALS — BP 98/64 | HR 84 | Temp 98.0°F | Ht 64.5 in | Wt 167.5 lb

## 2014-07-21 DIAGNOSIS — E781 Pure hyperglyceridemia: Secondary | ICD-10-CM

## 2014-07-21 DIAGNOSIS — Z0001 Encounter for general adult medical examination with abnormal findings: Secondary | ICD-10-CM | POA: Insufficient documentation

## 2014-07-21 DIAGNOSIS — Z0000 Encounter for general adult medical examination without abnormal findings: Secondary | ICD-10-CM

## 2014-07-21 MED ORDER — ATORVASTATIN CALCIUM 10 MG PO TABS: 10.0000 mg | ORAL_TABLET | Freq: Every day | ORAL | Status: DC

## 2014-07-21 NOTE — Assessment & Plan Note (Signed)
General medical exam normal today including breast exam. PAP and pelvic deferred as PAP normal in 2013. Will plan repeat PAP in 2016. Immunizations are UTD, except for flu vaccine which is declined.  Mammogram ordered. Reviewed recent labs including CMP, lipids.

## 2014-07-21 NOTE — Progress Notes (Signed)
Pre visit review using our clinic review tool, if applicable. No additional management support is needed unless otherwise documented below in the visit note. 

## 2014-07-21 NOTE — Patient Instructions (Signed)
Start Atorvastatin 10mg  daily to help lower triglycerides.  Repeat labs in 1 month.

## 2014-07-21 NOTE — Progress Notes (Signed)
Subjective:    Patient ID: Tanya Kramer, female    DOB: 07/14/1966, 48 y.o.   MRN: [id number]  HPI 48YO female presents for annual exam. Feeling well. No concerns today. She brings labs from her work which show elevated TG>400 and low HDL<40. She has never taken cholesterol medication. She tries to follow a healthy diet, but diet is limited because of chronic gastroparesis and nausea, to mostly bland foods.  Review of Systems  Constitutional: Negative for fever, chills, appetite change, fatigue and unexpected weight change.  Eyes: Negative for visual disturbance.  Respiratory: Negative for shortness of breath.   Cardiovascular: Negative for chest pain and leg swelling.  Gastrointestinal: Negative for nausea, vomiting, abdominal pain, diarrhea and constipation.  Musculoskeletal: Negative for arthralgias and myalgias.  Skin: Negative for color change and rash.  Hematological: Negative for adenopathy. Does not bruise/bleed easily.  Psychiatric/Behavioral: Negative for sleep disturbance and dysphoric mood. The patient is not nervous/anxious.        Objective:    BP 98/64  Pulse 84  Temp(Src) 98 F (36.7 C) (Oral)  Ht 5' 4.5" (1.638 m)  Wt 167 lb 8 oz (75.978 kg)  BMI 28.32 kg/m2  SpO2 93%  LMP 06/07/2014 Physical Exam  Constitutional: She is oriented to person, place, and time. She appears well-developed and well-nourished. No distress.  HENT:  Head: Normocephalic and atraumatic.  Right Ear: External ear normal.  Left Ear: External ear normal.  Nose: Nose normal.  Mouth/Throat: Oropharynx is clear and moist. No oropharyngeal exudate.  Eyes: Conjunctivae are normal. Pupils are equal, round, and reactive to light. Right eye exhibits no discharge. Left eye exhibits no discharge. No scleral icterus.  Neck: Normal range of motion. Neck supple. No tracheal deviation present. No thyromegaly present.  Cardiovascular: Normal rate, regular rhythm, normal heart sounds and intact  distal pulses.  Exam reveals no gallop and no friction rub.   No murmur heard. Pulmonary/Chest: Effort normal and breath sounds normal. No accessory muscle usage. Not tachypneic. No respiratory distress. She has no decreased breath sounds. She has no wheezes. She has no rhonchi. She has no rales. She exhibits no tenderness. Right breast exhibits no inverted nipple, no mass, no nipple discharge, no skin change and no tenderness. Left breast exhibits no inverted nipple, no mass, no nipple discharge, no skin change and no tenderness. Breasts are symmetrical.  Abdominal: Soft. Bowel sounds are normal. She exhibits no distension and no mass. There is no tenderness. There is no rebound and no guarding.  Musculoskeletal: Normal range of motion. She exhibits no edema and no tenderness.  Lymphadenopathy:    She has no cervical adenopathy.  Neurological: She is alert and oriented to person, place, and time. No cranial nerve deficit. She exhibits normal muscle tone. Coordination normal.  Skin: Skin is warm and dry. No rash noted. She is not diaphoretic. No erythema. No pallor.  Psychiatric: She has a normal mood and affect. Her behavior is normal. Judgment and thought content normal.          Assessment & Plan:   Problem List Items Addressed This Visit     Unprioritized   Hypertriglyceridemia     Reviewed recent labs. Will start Atorvastatin 9m daily. Discussed potential side effects of this medication. Recheck lipids and LFTs in 4 weeks.    Relevant Medications      atorvastatin (LIPITOR) tablet   Other Relevant Orders      Comp Met (CMET)      Lipid  Profile   Routine general medical examination at a health care facility - Primary     General medical exam normal today including breast exam. PAP and pelvic deferred as PAP normal in 2013. Will plan repeat PAP in 2016. Immunizations are UTD, except for flu vaccine which is declined.  Mammogram ordered. Reviewed recent labs including CMP, lipids.     Relevant Orders      MM Digital Screening       Return in about 4 weeks (around 08/18/2014) for Recheck.

## 2014-07-21 NOTE — Assessment & Plan Note (Signed)
Reviewed recent labs. Will start Atorvastatin 10mg  daily. Discussed potential side effects of this medication. Recheck lipids and LFTs in 4 weeks.

## 2016-04-07 ENCOUNTER — Other Ambulatory Visit (HOSPITAL_COMMUNITY)
Admission: RE | Admit: 2016-04-07 | Discharge: 2016-04-07 | Disposition: A | Discharge status: Home / Self Care | Payer: BC Managed Care – PPO | Source: Ambulatory Visit | Attending: Family Medicine | Admitting: Family Medicine

## 2016-04-07 ENCOUNTER — Ambulatory Visit (INDEPENDENT_AMBULATORY_CARE_PROVIDER_SITE_OTHER): Payer: BC Managed Care – PPO | Admitting: Family Medicine

## 2016-04-07 ENCOUNTER — Encounter: Payer: Self-pay | Admitting: Family Medicine

## 2016-04-07 VITALS — BP 110/66 | HR 91 | Temp 97.9°F | Ht 64.5 in | Wt 173.2 lb

## 2016-04-07 DIAGNOSIS — Z01419 Encounter for gynecological examination (general) (routine) without abnormal findings: Secondary | ICD-10-CM | POA: Insufficient documentation

## 2016-04-07 DIAGNOSIS — R350 Frequency of micturition: Secondary | ICD-10-CM

## 2016-04-07 DIAGNOSIS — N760 Acute vaginitis: Secondary | ICD-10-CM | POA: Insufficient documentation

## 2016-04-07 DIAGNOSIS — Z1151 Encounter for screening for human papillomavirus (HPV): Secondary | ICD-10-CM | POA: Insufficient documentation

## 2016-04-07 DIAGNOSIS — R3915 Urgency of urination: Secondary | ICD-10-CM | POA: Insufficient documentation

## 2016-04-07 DIAGNOSIS — Z113 Encounter for screening for infections with a predominantly sexual mode of transmission: Secondary | ICD-10-CM | POA: Diagnosis present

## 2016-04-07 DIAGNOSIS — N841 Polyp of cervix uteri: Secondary | ICD-10-CM

## 2016-04-07 HISTORY — DX: Polyp of cervix uteri: N84.1

## 2016-04-07 LAB — POCT URINALYSIS DIPSTICK
Bilirubin, UA: NEGATIVE
Glucose, UA: NEGATIVE
KETONES UA: NEGATIVE
Leukocytes, UA: NEGATIVE
Nitrite, UA: NEGATIVE
PROTEIN UA: NEGATIVE
RBC UA: NEGATIVE
SPEC GRAV UA: 1.025
Urobilinogen, UA: 0.2
pH, UA: 6

## 2016-04-07 NOTE — Progress Notes (Signed)
Patient ID: Tanya DykesLisa B Kramer, female   DOB: [birth year], 50 y.o.   MRN: [id number]  Tanya AlarEric Damier Disano, MD Phone: [phone]  Tanya DykesLisa B Kramer is a 50 y.o. female who presents today for same-day visit.  Patient reports several days of urinary urgency and frequency. She notes no abdominal pain, dysuria, or fevers. She does report minimal wetness in her underwear between episodes of urination, though no gross loss of bladder function. She does note some mild low back soreness bilaterally. No saddle anesthesia, bowel dysfunction, fevers, or history of cancer. Denies numbness and weakness in her legs. Denies vaginal discharge and irritation. Is sexually active. No history of STD.  ROS see history of present illness  Objective  Physical Exam Filed Vitals:   04/07/16 0814  BP: 110/66  Pulse: 91  Temp: 97.9 F (36.6 C)    BP Readings from Last 3 Encounters:  04/07/16 110/66  07/21/14 98/64  04/03/13 104/70   Wt Readings from Last 3 Encounters:  04/07/16 173 lb 3.2 oz (78.563 kg)  07/21/14 167 lb 8 oz (75.978 kg)  04/03/13 166 lb (75.297 kg)    Physical Exam  Constitutional: She is well-developed, well-nourished, and in no distress.  HENT:  Head: Normocephalic and atraumatic.  Cardiovascular: Normal rate, regular rhythm and normal heart sounds.   Pulmonary/Chest: Effort normal and breath sounds normal.  Abdominal: Soft. Bowel sounds are normal. She exhibits no distension. There is no tenderness. There is no rebound and no guarding.  Genitourinary:  Normal labia, normal vaginal mucosa with mild amount of clear mucousy discharge, there is some apparent cervical irritation and there is a 3 mm cervical polyp at 9:00, no cervical motion tenderness, no adnexal tenderness or masses, no apparent leakage of urine noted on evaluation of the urethra, urethra appears normal  Musculoskeletal: She exhibits no edema.  No midline spine tenderness, no midline spine step-off, lumbar lateral muscular  tenderness palpation with no spasm, skin changes, or swelling  Neurological: She is alert. Gait normal.  5 out of 5 strength bilateral quads, hamstrings, plantar flexion, and dorsiflexion, sensation light touch intact bilaterally lower extremities, 2+ patellar reflexes, perineal sensation intact  Skin: Skin is warm and dry. She is not diaphoretic.     Assessment/Plan: Please see individual problem list.  Urinary urgency Patient with symptoms of urgency and frequency. UA negative for UTI. Pelvic exam performed and revealed mild amount of clear discharge. Back exam with mild soreness in the musculature though no midline soreness. Patient's mild wetness between urination could be related to the vaginal discharge. No urine leakage seen on exam. No abnormalities of the urethra. Doubt this is related to the spinal column issue given lack of neurological findings. Benign abdominal exam. Benign neurological exam. We will send urine for culture. Pap smear was obtained and we'll also check gonorrhea/chlamydia and yeast, Trichomonas, and bacterial vaginosis for cause of her symptoms. Given return precautions.  Cervical polyp Refer to gynecology    Orders Placed This Encounter  Procedures  . Urine Culture  . Ambulatory referral to Gynecology    Referral Priority:  Routine    Referral Type:  Consultation    Referral Reason:  Specialty Services Required    Requested Specialty:  Gynecology    Number of Visits Requested:  1  . POCT Urinalysis Dipstick     Tanya AlarEric Irys Nigh, MD [facility] [place]

## 2016-04-07 NOTE — Assessment & Plan Note (Addendum)
Patient with symptoms of urgency and frequency. UA negative for UTI. Pelvic exam performed and revealed mild amount of clear discharge. Back exam with mild soreness in the musculature though no midline soreness. Patient's mild wetness between urination could be related to the vaginal discharge. No urine leakage seen on exam. No abnormalities of the urethra. Doubt this is related to the spinal column issue given lack of neurological findings. Benign abdominal exam. Benign neurological exam. We will send urine for culture. Pap smear was obtained and we'll also check gonorrhea/chlamydia and yeast, Trichomonas, and bacterial vaginosis for cause of her symptoms. Given return precautions.

## 2016-04-07 NOTE — Progress Notes (Signed)
Pre visit review using our clinic review tool, if applicable. No additional management support is needed unless otherwise documented below in the visit note. 

## 2016-04-07 NOTE — Patient Instructions (Signed)
Nice to meet you. Vertigo workup. Issue of urination with a urine culture, vaginal swabs, and a Pap smear. If you develop numbness, weakness, numbness between her legs, loss of bowel function, change in urinary or bladder function, fevers, or any new or change in symptoms please seek medical attention.

## 2016-04-07 NOTE — Assessment & Plan Note (Signed)
Refer to gynecology 

## 2016-04-08 LAB — CYTOLOGY - PAP

## 2016-04-10 ENCOUNTER — Telehealth: Payer: Self-pay | Admitting: Family Medicine

## 2016-04-10 LAB — URINE CULTURE

## 2016-04-10 MED ORDER — CEPHALEXIN 500 MG PO CAPS: 500.0000 mg | ORAL_CAPSULE | Freq: Two times a day (BID) | ORAL | Status: DC

## 2016-04-10 NOTE — Telephone Encounter (Signed)
Spoke with patient regarding urine culture results. Advised of UTI. Will treat with keflex. No antibiotic allergies noted by patient. Keflex sent to pharmacy.

## 2016-04-12 LAB — CERVICOVAGINAL ANCILLARY ONLY
BACTERIAL VAGINITIS: NEGATIVE
Candida vaginitis: NEGATIVE

## 2016-05-05 ENCOUNTER — Encounter: Payer: Self-pay | Admitting: Internal Medicine

## 2016-05-31 ENCOUNTER — Ambulatory Visit (INDEPENDENT_AMBULATORY_CARE_PROVIDER_SITE_OTHER): Payer: BC Managed Care – PPO | Admitting: Internal Medicine

## 2016-05-31 ENCOUNTER — Encounter: Payer: Self-pay | Admitting: Internal Medicine

## 2016-05-31 VITALS — BP 124/80 | HR 67 | Ht 64.0 in | Wt 173.6 lb

## 2016-05-31 DIAGNOSIS — Z0000 Encounter for general adult medical examination without abnormal findings: Secondary | ICD-10-CM

## 2016-05-31 DIAGNOSIS — R7989 Other specified abnormal findings of blood chemistry: Secondary | ICD-10-CM | POA: Diagnosis not present

## 2016-05-31 LAB — COMPREHENSIVE METABOLIC PANEL
ALT: 17 U/L (ref 0–35)
AST: 15 U/L (ref 0–37)
Albumin: 4.2 g/dL (ref 3.5–5.2)
Alkaline Phosphatase: 71 U/L (ref 39–117)
BUN: 12 mg/dL (ref 6–23)
CALCIUM: 9.3 mg/dL (ref 8.4–10.5)
CHLORIDE: 103 meq/L (ref 96–112)
CO2: 28 meq/L (ref 19–32)
Creatinine, Ser: 0.79 mg/dL (ref 0.40–1.20)
GFR: 82.57 mL/min (ref >60.00)
Glucose, Bld: 92 mg/dL (ref 70–99)
Potassium: 4.5 mEq/L (ref 3.5–5.1)
Sodium: 138 mEq/L (ref 135–145)
Total Bilirubin: 0.4 mg/dL (ref 0.2–1.2)
Total Protein: 7.2 g/dL (ref 6.0–8.3)

## 2016-05-31 LAB — CBC WITH DIFFERENTIAL/PLATELET
BASOS PCT: 0.7 % (ref 0.0–3.0)
Basophils Absolute: 0.1 10*3/uL (ref 0.0–0.1)
Eosinophils Absolute: 1.3 10*3/uL — ABNORMAL HIGH (ref 0.0–0.7)
Eosinophils Relative: 14.5 % — ABNORMAL HIGH (ref 0.0–5.0)
HEMATOCRIT: 37.6 % (ref 36.0–46.0)
Hemoglobin: 12.8 g/dL (ref 12.0–15.0)
LYMPHS PCT: 22.9 % (ref 12.0–46.0)
Lymphs Abs: 2.1 10*3/uL (ref 0.7–4.0)
MCHC: 34 g/dL (ref 30.0–36.0)
MCV: 87.8 fl (ref 78.0–100.0)
MONOS PCT: 5.5 % (ref 3.0–12.0)
Monocytes Absolute: 0.5 10*3/uL (ref 0.1–1.0)
NEUTROS ABS: 5.1 10*3/uL (ref 1.4–7.7)
Neutrophils Relative %: 56.4 % (ref 43.0–77.0)
PLATELETS: 258 10*3/uL (ref 150.0–400.0)
RBC: 4.28 Mil/uL (ref 3.87–5.11)
RDW: 12.6 % (ref 11.5–15.5)
WBC: 9.1 10*3/uL (ref 4.0–10.5)

## 2016-05-31 LAB — VITAMIN D 25 HYDROXY (VIT D DEFICIENCY, FRACTURES): VITD: 24.71 ng/mL — ABNORMAL LOW (ref 30.00–100.00)

## 2016-05-31 LAB — TSH: TSH: 1.61 u[IU]/mL (ref 0.35–4.50)

## 2016-05-31 LAB — LIPID PANEL
CHOL/HDL RATIO: 4
Cholesterol: 174 mg/dL (ref 0–200)
HDL: 40.1 mg/dL (ref >39.00)
NONHDL: 133.63
TRIGLYCERIDES: 244 mg/dL — AB (ref 0.0–149.0)
VLDL: 48.8 mg/dL — ABNORMAL HIGH (ref 0.0–40.0)

## 2016-05-31 LAB — LDL CHOLESTEROL, DIRECT: Direct LDL: 99 mg/dL

## 2016-05-31 NOTE — Patient Instructions (Signed)
Health Maintenance, Female Adopting a healthy lifestyle and getting preventive care can go a long way to promote health and wellness. Talk with your health care provider about what schedule of regular examinations is right for you. This is a good chance for you to check in with your provider about disease prevention and staying healthy. In between checkups, there are plenty of things you can do on your own. Experts have done a lot of research about which lifestyle changes and preventive measures are most likely to keep you healthy. Ask your health care provider for more information. WEIGHT AND DIET  Eat a healthy diet  Be sure to include plenty of vegetables, fruits, low-fat dairy products, and lean protein.  Do not eat a lot of foods high in solid fats, added sugars, or salt.  Get regular exercise. This is one of the most important things you can do for your health.  Most adults should exercise for at least 150 minutes each week. The exercise should increase your heart rate and make you sweat (moderate-intensity exercise).  Most adults should also do strengthening exercises at least twice a week. This is in addition to the moderate-intensity exercise.  Maintain a healthy weight  Body mass index (BMI) is a measurement that can be used to identify possible weight problems. It estimates body fat based on height and weight. Your health care provider can help determine your BMI and help you achieve or maintain a healthy weight.  For females 20 years of age and older:   A BMI below 18.5 is considered underweight.  A BMI of 18.5 to 24.9 is normal.  A BMI of 25 to 29.9 is considered overweight.  A BMI of 30 and above is considered obese.  Watch levels of cholesterol and blood lipids  You should start having your blood tested for lipids and cholesterol at 48 years of age, then have this test every 5 years.  You may need to have your cholesterol levels checked more often if:  Your lipid  or cholesterol levels are high.  You are older than 48 years of age.  You are at high risk for heart disease.  CANCER SCREENING   Lung Cancer  Lung cancer screening is recommended for adults 55-80 years old who are at high risk for lung cancer because of a history of smoking.  A yearly low-dose CT scan of the lungs is recommended for people who:  Currently smoke.  Have quit within the past 15 years.  Have at least a 30-pack-year history of smoking. A pack year is smoking an average of one pack of cigarettes a day for 1 year.  Yearly screening should continue until it has been 15 years since you quit.  Yearly screening should stop if you develop a health problem that would prevent you from having lung cancer treatment.  Breast Cancer  Practice breast self-awareness. This means understanding how your breasts normally appear and feel.  It also means doing regular breast self-exams. Let your health care provider know about any changes, no matter how small.  If you are in your 20s or 30s, you should have a clinical breast exam (CBE) by a health care provider every 1-3 years as part of a regular health exam.  If you are 40 or older, have a CBE every year. Also consider having a breast X-ray (mammogram) every year.  If you have a family history of breast cancer, talk to your health care provider about genetic screening.  If you   are at high risk for breast cancer, talk to your health care provider about having an MRI and a mammogram every year.  Breast cancer gene (BRCA) assessment is recommended for women who have family members with BRCA-related cancers. BRCA-related cancers include:  Breast.  Ovarian.  Tubal.  Peritoneal cancers.  Results of the assessment will determine the need for genetic counseling and BRCA1 and BRCA2 testing. Cervical Cancer Your health care provider may recommend that you be screened regularly for cancer of the pelvic organs (ovaries, uterus, and  vagina). This screening involves a pelvic examination, including checking for microscopic changes to the surface of your cervix (Pap test). You may be encouraged to have this screening done every 3 years, beginning at age 21.  For women ages 30-65, health care providers may recommend pelvic exams and Pap testing every 3 years, or they may recommend the Pap and pelvic exam, combined with testing for human papilloma virus (HPV), every 5 years. Some types of HPV increase your risk of cervical cancer. Testing for HPV may also be done on women of any age with unclear Pap test results.  Other health care providers may not recommend any screening for nonpregnant women who are considered low risk for pelvic cancer and who do not have symptoms. Ask your health care provider if a screening pelvic exam is right for you.  If you have had past treatment for cervical cancer or a condition that could lead to cancer, you need Pap tests and screening for cancer for at least 20 years after your treatment. If Pap tests have been discontinued, your risk factors (such as having a new sexual partner) need to be reassessed to determine if screening should resume. Some women have medical problems that increase the chance of getting cervical cancer. In these cases, your health care provider may recommend more frequent screening and Pap tests. Colorectal Cancer  This type of cancer can be detected and often prevented.  Routine colorectal cancer screening usually begins at 48 years of age and continues through 48 years of age.  Your health care provider may recommend screening at an earlier age if you have risk factors for colon cancer.  Your health care provider may also recommend using home test kits to check for hidden blood in the stool.  A small camera at the end of a tube can be used to examine your colon directly (sigmoidoscopy or colonoscopy). This is done to check for the earliest forms of colorectal  cancer.  Routine screening usually begins at age 50.  Direct examination of the colon should be repeated every 5-10 years through 48 years of age. However, you may need to be screened more often if early forms of precancerous polyps or small growths are found. Skin Cancer  Check your skin from head to toe regularly.  Tell your health care provider about any new moles or changes in moles, especially if there is a change in a mole's shape or color.  Also tell your health care provider if you have a mole that is larger than the size of a pencil eraser.  Always use sunscreen. Apply sunscreen liberally and repeatedly throughout the day.  Protect yourself by wearing long sleeves, pants, a wide-brimmed hat, and sunglasses whenever you are outside. HEART DISEASE, DIABETES, AND HIGH BLOOD PRESSURE   High blood pressure causes heart disease and increases the risk of stroke. High blood pressure is more likely to develop in:  People who have blood pressure in the high end   of the normal range (130-139/85-89 mm Hg).  People who are overweight or obese.  People who are African American.  If you are 38-23 years of age, have your blood pressure checked every 3-5 years. If you are 61 years of age or older, have your blood pressure checked every year. You should have your blood pressure measured twice--once when you are at a hospital or clinic, and once when you are not at a hospital or clinic. Record the average of the two measurements. To check your blood pressure when you are not at a hospital or clinic, you can use:  An automated blood pressure machine at a pharmacy.  A home blood pressure monitor.  If you are between 45 years and 39 years old, ask your health care provider if you should take aspirin to prevent strokes.  Have regular diabetes screenings. This involves taking a blood sample to check your fasting blood sugar level.  If you are at a normal weight and have a low risk for diabetes,  have this test once every three years after 48 years of age.  If you are overweight and have a high risk for diabetes, consider being tested at a younger age or more often. PREVENTING INFECTION  Hepatitis B  If you have a higher risk for hepatitis B, you should be screened for this virus. You are considered at high risk for hepatitis B if:  You were born in a country where hepatitis B is common. Ask your health care provider which countries are considered high risk.  Your parents were born in a high-risk country, and you have not been immunized against hepatitis B (hepatitis B vaccine).  You have HIV or AIDS.  You use needles to inject street drugs.  You live with someone who has hepatitis B.  You have had sex with someone who has hepatitis B.  You get hemodialysis treatment.  You take certain medicines for conditions, including cancer, organ transplantation, and autoimmune conditions. Hepatitis C  Blood testing is recommended for:  Everyone born from [birth year] through 1963.  Anyone with known risk factors for hepatitis C. Sexually transmitted infections (STIs)  You should be screened for sexually transmitted infections (STIs) including gonorrhea and chlamydia if:  You are sexually active and are younger than 48 years of age.  You are older than 48 years of age and your health care provider tells you that you are at risk for this type of infection.  Your sexual activity has changed since you were last screened and you are at an increased risk for chlamydia or gonorrhea. Ask your health care provider if you are at risk.  If you do not have HIV, but are at risk, it may be recommended that you take a prescription medicine daily to prevent HIV infection. This is called pre-exposure prophylaxis (PrEP). You are considered at risk if:  You are sexually active and do not regularly use condoms or know the HIV status of your partner(s).  You take drugs by injection.  You are sexually  active with a partner who has HIV. Talk with your health care provider about whether you are at high risk of being infected with HIV. If you choose to begin PrEP, you should first be tested for HIV. You should then be tested every 3 months for as long as you are taking PrEP.  PREGNANCY   If you are premenopausal and you may become pregnant, ask your health care provider about preconception counseling.  If you may  become pregnant, take 400 to 800 micrograms (mcg) of folic acid every day.  If you want to prevent pregnancy, talk to your health care provider about birth control (contraception). OSTEOPOROSIS AND MENOPAUSE   Osteoporosis is a disease in which the bones lose minerals and strength with aging. This can result in serious bone fractures. Your risk for osteoporosis can be identified using a bone density scan.  If you are 61 years of age or older, or if you are at risk for osteoporosis and fractures, ask your health care provider if you should be screened.  Ask your health care provider whether you should take a calcium or vitamin D supplement to lower your risk for osteoporosis.  Menopause may have certain physical symptoms and risks.  Hormone replacement therapy may reduce some of these symptoms and risks. Talk to your health care provider about whether hormone replacement therapy is right for you.  HOME CARE INSTRUCTIONS   Schedule regular health, dental, and eye exams.  Stay current with your immunizations.   Do not use any tobacco products including cigarettes, chewing tobacco, or electronic cigarettes.  If you are pregnant, do not drink alcohol.  If you are breastfeeding, limit how much and how often you drink alcohol.  Limit alcohol intake to no more than 1 drink per day for nonpregnant women. One drink equals 12 ounces of beer, 5 ounces of wine, or 1 ounces of hard liquor.  Do not use street drugs.  Do not share needles.  Ask your health care provider for help if  you need support or information about quitting drugs.  Tell your health care provider if you often feel depressed.  Tell your health care provider if you have ever been abused or do not feel safe at home.   This information is not intended to replace advice given to you by your health care provider. Make sure you discuss any questions you have with your health care provider.   Document Released: 06/20/2011 Document Revised: 12/26/2014 Document Reviewed: 11/06/2013 Elsevier Interactive Patient Education [employer].

## 2016-05-31 NOTE — Progress Notes (Signed)
Subjective:    Patient ID: Tanya DykesLisa B Kramer, female    DOB: [birth year], 50 y.o.   MRN: [id number]  HPI  50YO female presents for physical exam.  Seen for UTI by Dr. Birdie SonsSonnenberg. Had PAP at that visit, which was normal, HPV neg. Also noted to have cervical polyp. GYN evaluation pending.  Feeling well. No concerns.  Wt Readings from Last 3 Encounters:  05/31/16 173 lb 10.1 oz (78.758 kg)  04/07/16 173 lb 3.2 oz (78.563 kg)  07/21/14 167 lb 8 oz (75.978 kg)   BP Readings from Last 3 Encounters:  05/31/16 124/80  04/07/16 110/66  07/21/14 98/64    Past Medical History  Diagnosis Date  . Asthma   . History of chicken pox   . Depression   . GERD (gastroesophageal reflux disease)   . Allergic rhinitis    Family History  Problem Relation Age of Onset  . Hyperlipidemia Mother   . Rheum arthritis Father   . Arthritis Father   . Asthma Brother   . Hyperlipidemia Brother   . Depression Maternal Aunt     Bi-polar  . Cancer Maternal Uncle     Lung Ca - 70's  . Diabetes Paternal Aunt   . Diabetes Paternal Uncle   . Stroke Maternal Grandmother   . Cancer Maternal Grandfather     Colon & prostate - 70's  . Colon cancer Maternal Grandfather   . Diabetes Paternal Grandmother    Past Surgical History  Procedure Laterality Date  . Cholecystectomy  08/2011    Dr Excell Seltzerooper  . Nasal sinus surgery  2007   Social History   Social History  . Marital Status: Married    Spouse Name: N/A  . Number of Children: 2  . Years of Education: N/A   Occupational History  . Office assistant    Social History Main Topics  . Smoking status: Never Smoker   . Smokeless tobacco: Never Used  . Alcohol Use: Yes     Comment: Occasional  . Drug Use: No  . Sexual Activity: Not Asked   Other Topics Concern  . None   Social History Narrative   [place] in [place] with husband and 2 children. Works at [employer].      Regular Exercise -  Walk 2 to 3 times a week, 1.5 miles   Daily  Caffeine Use:  Diet coke 2 to 3 16oz bottles                 Review of Systems  Constitutional: Negative for fever, chills, appetite change, fatigue and unexpected weight change.  Eyes: Negative for visual disturbance.  Respiratory: Negative for shortness of breath.   Cardiovascular: Negative for chest pain and leg swelling.  Gastrointestinal: Negative for nausea, vomiting, abdominal pain, diarrhea and constipation.  Musculoskeletal: Negative for myalgias and arthralgias.  Skin: Negative for color change and rash.  Hematological: Negative for adenopathy. Does not bruise/bleed easily.  Psychiatric/Behavioral: Negative for sleep disturbance and dysphoric mood. The patient is not nervous/anxious.        Objective:    BP 124/80 mmHg  Pulse 67  Ht 5\' 4"  (1.626 m)  Wt 173 lb 10.1 oz (78.758 kg)  BMI 29.79 kg/m2  SpO2 97%  LMP 05/24/2016 Physical Exam  Constitutional: She is oriented to person, place, and time. She appears well-developed and well-nourished. No distress.  HENT:  Head: Normocephalic and atraumatic.  Right Ear: External ear normal.  Left Ear: External ear normal.  Nose: Nose normal.  Mouth/Throat: Oropharynx is clear and moist. No oropharyngeal exudate.  Eyes: Conjunctivae are normal. Pupils are equal, round, and reactive to light. Right eye exhibits no discharge. Left eye exhibits no discharge. No scleral icterus.  Neck: Normal range of motion. Neck supple. No tracheal deviation present. No thyromegaly present.  Cardiovascular: Normal rate, regular rhythm, normal heart sounds and intact distal pulses.  Exam reveals no gallop and no friction rub.   No murmur heard. Pulmonary/Chest: Effort normal and breath sounds normal. No accessory muscle usage. No tachypnea. No respiratory distress. She has no decreased breath sounds. She has no wheezes. She has no rales. She exhibits no tenderness. Right breast exhibits no inverted nipple, no mass, no nipple discharge, no skin  change and no tenderness. Left breast exhibits no inverted nipple, no mass, no nipple discharge, no skin change and no tenderness. Breasts are symmetrical.  Abdominal: Soft. Bowel sounds are normal. She exhibits no distension and no mass. There is no tenderness. There is no rebound and no guarding.  Musculoskeletal: Normal range of motion. She exhibits no edema or tenderness.  Lymphadenopathy:    She has no cervical adenopathy.  Neurological: She is alert and oriented to person, place, and time. No cranial nerve deficit. She exhibits normal muscle tone. Coordination normal.  Skin: Skin is warm and dry. No rash noted. She is not diaphoretic. No erythema. No pallor.  Psychiatric: She has a normal mood and affect. Her behavior is normal. Judgment and thought content normal.          Assessment & Plan:   Problem List Items Addressed This Visit      Unprioritized   Routine general medical examination at a health care facility - Primary    General medical exam normal today including breast exam. PAP and pelvic deferred as PAP normal, HPV neg in 03/2016. Labs ordered. Mammogram ordered. Encouraged healthy diet and exercise.      Relevant Orders   TSH   CBC with Differential/Platelet   Comprehensive metabolic panel   Lipid panel   Microalbumin / creatinine urine ratio   VITAMIN D 25 Hydroxy (Vit-D Deficiency, Fractures)   MM Digital Screening       Return in about 1 year (around 05/31/2017) for Physical.  Ronna Polio, MD Internal Medicine [facility]

## 2016-05-31 NOTE — Progress Notes (Signed)
Pre visit review using our clinic review tool, if applicable. No additional management support is needed unless otherwise documented below in the visit note. 

## 2016-05-31 NOTE — Assessment & Plan Note (Signed)
General medical exam normal today including breast exam. PAP and pelvic deferred as PAP normal, HPV neg in 03/2016. Labs ordered. Mammogram ordered. Encouraged healthy diet and exercise.

## 2016-06-02 ENCOUNTER — Encounter: Payer: Self-pay | Admitting: Obstetrics and Gynecology

## 2016-06-02 ENCOUNTER — Ambulatory Visit (INDEPENDENT_AMBULATORY_CARE_PROVIDER_SITE_OTHER): Payer: BC Managed Care – PPO | Admitting: Obstetrics and Gynecology

## 2016-06-02 VITALS — BP 101/68 | HR 80 | Ht 64.0 in | Wt 168.9 lb

## 2016-06-02 DIAGNOSIS — N841 Polyp of cervix uteri: Secondary | ICD-10-CM

## 2016-06-02 NOTE — Progress Notes (Signed)
GYNECOLOGY PROGRESS NOTE  Subjective:    Patient ID: Tanya Kramer, female    DOB: 1966/10/26, 50 y.o.   MRN: [id number]  HPI  Patient is a 50 y.o. female who presents as a referral from [facility] for incidental finding of cervical polyp noted on pap smear.  Pap smear was performed 03/2016, was normal.  Patient currently denies complaints, denies postcoital or intermenstrual bleeding, or pain.  Patient's last menstrual period was 05/24/2016.   Past Medical History  Diagnosis Date  . Asthma   . History of chicken pox   . Depression   . GERD (gastroesophageal reflux disease)   . Allergic rhinitis     Family History  Problem Relation Age of Onset  . Hyperlipidemia Mother   . Rheum arthritis Father   . Arthritis Father   . Asthma Brother   . Hyperlipidemia Brother   . Depression Maternal Aunt     Bi-polar  . Cancer Maternal Uncle     Lung Ca - 70's  . Diabetes Paternal Aunt   . Diabetes Paternal Uncle   . Stroke Maternal Grandmother   . Cancer Maternal Grandfather     Colon & prostate - 70's  . Colon cancer Maternal Grandfather   . Diabetes Paternal Grandmother    Past Medical History  Diagnosis Date  . Asthma   . History of chicken pox   . Depression   . GERD (gastroesophageal reflux disease)   . Allergic rhinitis     Social History   Social History  . Marital Status: Married    Spouse Name: N/A  . Number of Children: 2  . Years of Education: N/A   Occupational History  . Office assistant    Social History Main Topics  . Smoking status: Never Smoker   . Smokeless tobacco: Never Used  . Alcohol Use: Yes     Comment: Occasional  . Drug Use: No  . Sexual Activity: Yes    Birth Control/ Protection: Surgical     Comment: Partner had vasectomy    Other Topics Concern  . Not on file   Social History Narrative   Lives in [place] with husband and 2 children. Works at [employer].      Regular Exercise -  Walk 2 to 3 times a week,  1.5 miles   Daily Caffeine Use:  Diet coke 2 to 3 16oz bottles                 Current Outpatient Prescriptions on File Prior to Visit  Medication Sig Dispense Refill  . albuterol (PROAIR HFA) 108 (90 BASE) MCG/ACT inhaler Inhale 2 puffs into the lungs every 6 (six) hours as needed. 1 Inhaler 3  . cetirizine (ZYRTEC) 10 MG tablet Take 10 mg by mouth daily.     No current facility-administered medications on file prior to visit.    No Known Allergies    Review of Systems Pertinent items noted in HPI and remainder of comprehensive ROS otherwise negative.   Objective:   Blood pressure 101/68, pulse 80, height  (1.626 m), weight 168 lb 14.4 oz (76.613 kg), last menstrual period 05/24/2016. General appearance: alert and no distress Abdomen: soft, non-tender; bowel sounds normal; no masses,  no organomegaly Pelvic: external genitalia normal, rectovaginal septum normal.  Vagina without discharge.  Cervix normal appearing, small cervical polyp noted at right ectocervix at 11 o'clock, no motion tenderness.  Uterus mobile, nontender, normal shape and size.  Adnexae  non-palpable, nontender bilaterally.  Extremities: extremities normal, atraumatic, no cyanosis or edema Neurologic: Alert and oriented X 3, normal strength and tone. Normal symmetric reflexes. Normal coordination and gait.    Assessment:   Cervical polyp, asymptomatic  Plan:   Cervical polypectomy performed today, grasped with Tishler biopsy forceps.  Sent for pathology.  Biopsy site cauterized with silver nitrate stick. Given bleeding precautions. Can take Ibuprofen/Tylenol as needed if cramping occurs.  Call back in 1 week for results.    Hildred LaserAnika Kessa Fairbairn, MD [facility]

## 2016-06-06 LAB — PATHOLOGY

## 2016-08-15 ENCOUNTER — Other Ambulatory Visit: Payer: Self-pay | Admitting: Internal Medicine

## 2016-08-16 ENCOUNTER — Ambulatory Visit: Payer: BC Managed Care – PPO

## 2016-10-12 ENCOUNTER — Telehealth: Payer: Self-pay | Admitting: Internal Medicine

## 2016-10-12 DIAGNOSIS — Z1239 Encounter for other screening for malignant neoplasm of breast: Secondary | ICD-10-CM

## 2016-10-12 NOTE — Telephone Encounter (Signed)
Pt scheduled an appt to establish care with Dr. Birdie SonsSonnenberg. Pt needs a new order put in for a Mammogram because the one that she had expired. Can Dr. Birdie SonsSonnenberg write her a new order?  Please advise, thank you!  Call pt @ [phone]

## 2016-10-12 NOTE — Telephone Encounter (Signed)
Can you place order?

## 2016-10-17 NOTE — Telephone Encounter (Signed)
Order placed

## 2016-10-18 NOTE — Telephone Encounter (Signed)
Unable to leave message

## 2016-12-05 ENCOUNTER — Encounter: Payer: Self-pay | Admitting: Family Medicine

## 2016-12-05 ENCOUNTER — Ambulatory Visit (INDEPENDENT_AMBULATORY_CARE_PROVIDER_SITE_OTHER): Payer: BC Managed Care – PPO | Admitting: Family Medicine

## 2016-12-05 VITALS — BP 106/62 | HR 76 | Temp 98.4°F | Wt 178.2 lb

## 2016-12-05 DIAGNOSIS — J4520 Mild intermittent asthma, uncomplicated: Secondary | ICD-10-CM

## 2016-12-05 DIAGNOSIS — Z1231 Encounter for screening mammogram for malignant neoplasm of breast: Secondary | ICD-10-CM

## 2016-12-05 DIAGNOSIS — J3089 Other allergic rhinitis: Secondary | ICD-10-CM | POA: Diagnosis not present

## 2016-12-05 DIAGNOSIS — K219 Gastro-esophageal reflux disease without esophagitis: Secondary | ICD-10-CM | POA: Diagnosis not present

## 2016-12-05 DIAGNOSIS — Z23 Encounter for immunization: Secondary | ICD-10-CM | POA: Diagnosis not present

## 2016-12-05 DIAGNOSIS — J309 Allergic rhinitis, unspecified: Secondary | ICD-10-CM | POA: Insufficient documentation

## 2016-12-05 DIAGNOSIS — Z1239 Encounter for other screening for malignant neoplasm of breast: Secondary | ICD-10-CM

## 2016-12-05 NOTE — Assessment & Plan Note (Signed)
Patient with symptoms consistent with allergic rhinitis. Suspect this is the cause of her cough. Discussed changing Zyrtec to Claritin or Allegra. Can start on Flonase as well. If no improvement could consider Singulair. She will continue to monitor.

## 2016-12-05 NOTE — Assessment & Plan Note (Signed)
Depends on what food she eats. Otherwise asymptomatic. Discussed starting on medication for this though she deferred and opted to avoid triggers. She'll continue to monitor.

## 2016-12-05 NOTE — Progress Notes (Signed)
Pre visit review using our clinic review tool, if applicable. No additional management support is needed unless otherwise documented below in the visit note. 

## 2016-12-05 NOTE — Progress Notes (Signed)
  Marikay AlarEric Myisha Pickerel, MD Phone: [phone]  Paul DykesLisa B Ebert is a 50 y.o. female who presents today for follow-up.  GERD: Patient notes occasional burning and sour taste depending on what she eats. [place] and tomato sauce triggers it. Has history of gastroparesis. No blood in her stool. No abdominal pain.  Asthma: Notes certain things trigger it. Being out in the leads about a month ago triggered it. Uses her albuterol once a month with good benefit. Does have fairly frequent cough. No wheezing. No shortness of breath.  Allergic rhinitis: Does have persistent drainage and rhinorrhea. Has had allergy testing and received allergy shots in the past. Zyrtec has not been very beneficial.  PMH: nonsmoker.   ROS see history of present illness  Objective  Physical Exam Vitals:   12/05/16 1536  BP: 106/62  Pulse: 76  Temp: 98.4 F (36.9 C)    BP Readings from Last 3 Encounters:  12/05/16 106/62  06/02/16 101/68  05/31/16 124/80   Wt Readings from Last 3 Encounters:  12/05/16 178 lb 3.2 oz (80.8 kg)  06/02/16 168 lb 14.4 oz (76.6 kg)  05/31/16 173 lb 10.1 oz (78.8 kg)    Physical Exam  Constitutional: No distress.  HENT:  Head: Normocephalic and atraumatic.  Mouth/Throat: Oropharynx is clear and moist. No oropharyngeal exudate.  Eyes: Conjunctivae are normal. Pupils are equal, round, and reactive to light.  Cardiovascular: Normal rate, regular rhythm and normal heart sounds.   Pulmonary/Chest: Effort normal and breath sounds normal.  Abdominal: Soft. Bowel sounds are normal. She exhibits no distension. There is no tenderness. There is no rebound and no guarding.  Musculoskeletal: She exhibits no edema.  Neurological: She is alert.  Skin: She is not diaphoretic.     Assessment/Plan: Please see individual problem list.  Asthma Seems to be well-controlled given lack of albuterol use. Discussed continuing use albuterol as needed. If has exacerbation she'll follow-up.  GERD  (gastroesophageal reflux disease) Depends on what food she eats. Otherwise asymptomatic. Discussed starting on medication for this though she deferred and opted to avoid triggers. She'll continue to monitor.  Allergic rhinitis Patient with symptoms consistent with allergic rhinitis. Suspect this is the cause of her cough. Discussed changing Zyrtec to Claritin or Allegra. Can start on Flonase as well. If no improvement could consider Singulair. She will continue to monitor.   Orders Placed This Encounter  Procedures  . MM Digital Screening    Standing Status:   Future    Standing Expiration Date:   02/05/2018    Order Specific Question:   Reason for Exam (SYMPTOM  OR DIAGNOSIS REQUIRED)    Answer:   breast cancer screening    Order Specific Question:   Is the patient pregnant?    Answer:   No    Order Specific Question:   Preferred imaging location?    Answer:   [facility]  . Flu Vaccine QUAD 36+ mos IM    No orders of the defined types were placed in this encounter.   # Healthcare maintenance: Mammogram ordered.   Marikay AlarEric Deborahann Poteat, MD [facility] [place]

## 2016-12-05 NOTE — Assessment & Plan Note (Signed)
Seems to be well-controlled given lack of albuterol use. Discussed continuing use albuterol as needed. If has exacerbation she'll follow-up.

## 2016-12-05 NOTE — Patient Instructions (Signed)
Nice to see you. Please continue albuterol as needed for your asthma. Please avoid foods that trigger reflux. You can consider Flonase and Claritin or your allergies. If you have worsening symptoms or develop any new or changing symptoms please seek medical attention medially.

## 2016-12-26 ENCOUNTER — Encounter: Payer: Self-pay | Admitting: Family Medicine

## 2017-01-30 ENCOUNTER — Ambulatory Visit
Admission: RE | Admit: 2017-01-30 | Discharge: 2017-01-30 | Disposition: A | Discharge status: Home / Self Care | Payer: BC Managed Care – PPO | Source: Ambulatory Visit | Attending: Family Medicine | Admitting: Family Medicine

## 2017-01-30 DIAGNOSIS — Z1231 Encounter for screening mammogram for malignant neoplasm of breast: Secondary | ICD-10-CM | POA: Insufficient documentation

## 2017-01-30 DIAGNOSIS — Z1239 Encounter for other screening for malignant neoplasm of breast: Secondary | ICD-10-CM

## 2017-06-01 ENCOUNTER — Encounter: Payer: BC Managed Care – PPO | Admitting: Internal Medicine

## 2017-06-05 ENCOUNTER — Encounter: Payer: Self-pay | Admitting: Family Medicine

## 2017-06-05 ENCOUNTER — Ambulatory Visit (INDEPENDENT_AMBULATORY_CARE_PROVIDER_SITE_OTHER): Payer: BC Managed Care – PPO | Admitting: Family Medicine

## 2017-06-05 VITALS — BP 116/72 | HR 98 | Temp 98.4°F | Ht 64.0 in | Wt 147.2 lb

## 2017-06-05 DIAGNOSIS — E663 Overweight: Secondary | ICD-10-CM | POA: Diagnosis not present

## 2017-06-05 DIAGNOSIS — Z1329 Encounter for screening for other suspected endocrine disorder: Secondary | ICD-10-CM | POA: Diagnosis not present

## 2017-06-05 DIAGNOSIS — Z13220 Encounter for screening for lipoid disorders: Secondary | ICD-10-CM | POA: Diagnosis not present

## 2017-06-05 DIAGNOSIS — E559 Vitamin D deficiency, unspecified: Secondary | ICD-10-CM | POA: Diagnosis not present

## 2017-06-05 DIAGNOSIS — Z23 Encounter for immunization: Secondary | ICD-10-CM

## 2017-06-05 DIAGNOSIS — Z130 Encounter for screening for diseases of the blood and blood-forming organs and certain disorders involving the immune mechanism: Secondary | ICD-10-CM

## 2017-06-05 DIAGNOSIS — Z0001 Encounter for general adult medical examination with abnormal findings: Secondary | ICD-10-CM | POA: Diagnosis not present

## 2017-06-05 LAB — CBC
HCT: 41.2 % (ref 36.0–46.0)
Hemoglobin: 13.8 g/dL (ref 12.0–15.0)
MCHC: 33.5 g/dL (ref 30.0–36.0)
MCV: 90.1 fl (ref 78.0–100.0)
Platelets: 222 10*3/uL (ref 150.0–400.0)
RBC: 4.57 Mil/uL (ref 3.87–5.11)
RDW: 13.4 % (ref 11.5–15.5)
WBC: 7.5 10*3/uL (ref 4.0–10.5)

## 2017-06-05 LAB — COMPREHENSIVE METABOLIC PANEL
ALK PHOS: 83 U/L (ref 39–117)
ALT: 16 U/L (ref 0–35)
AST: 15 U/L (ref 0–37)
Albumin: 4.3 g/dL (ref 3.5–5.2)
BILIRUBIN TOTAL: 0.5 mg/dL (ref 0.2–1.2)
BUN: 8 mg/dL (ref 6–23)
CALCIUM: 9.5 mg/dL (ref 8.4–10.5)
CO2: 27 mEq/L (ref 19–32)
Chloride: 104 mEq/L (ref 96–112)
Creatinine, Ser: 0.69 mg/dL (ref 0.40–1.20)
GFR: 96.11 mL/min (ref >60.00)
Glucose, Bld: 96 mg/dL (ref 70–99)
POTASSIUM: 3.8 meq/L (ref 3.5–5.1)
Sodium: 138 mEq/L (ref 135–145)
TOTAL PROTEIN: 7 g/dL (ref 6.0–8.3)

## 2017-06-05 LAB — LIPID PANEL
Cholesterol: 165 mg/dL (ref 0–200)
HDL: 33.7 mg/dL — AB (ref >39.00)
LDL Cholesterol: 103 mg/dL — ABNORMAL HIGH (ref 0–99)
NonHDL: 130.86
TRIGLYCERIDES: 141 mg/dL (ref 0.0–149.0)
Total CHOL/HDL Ratio: 5
VLDL: 28.2 mg/dL (ref 0.0–40.0)

## 2017-06-05 LAB — HEMOGLOBIN A1C: Hgb A1c MFr Bld: 5.1 % (ref 4.6–6.5)

## 2017-06-05 LAB — VITAMIN D 25 HYDROXY (VIT D DEFICIENCY, FRACTURES): VITD: 21.72 ng/mL — AB (ref 30.00–100.00)

## 2017-06-05 LAB — TSH: TSH: 1.92 u[IU]/mL (ref 0.35–4.50)

## 2017-06-05 NOTE — Patient Instructions (Addendum)
Nice to see you. Please continue to work on diet and exercise. We will get lab work and contact you with the results. Please try adding Flonase to your current regimen for your congestion.  If your symptoms do not improve please let us know.

## 2017-06-05 NOTE — Progress Notes (Signed)
Tommi Rumps, MD Phone: [phone]  Tanya Kramer is a 50 y.o. female who presents today for physical exam.  Exercises by walking 3-4 times a week. Diet consists of mostly proteins, fats, and vegetables. Due for tetanus vaccination. Pap smear last year was negative for cells and HPV. Has a menstrual cycle every 6 months or so. This has been going on for 2 years. She reports she discussed this with the gynecologist last year. Mammogram up-to-date. No history of colon cancer in her family. Her grandfather on the maternal side did have prostate cancer. Declines HIV testing. No tobacco use or illicit drug use. Occasional alcohol use.  Patient reports 4 days of left ear congestion and left sinus fullness. She's not blowing anything out of her nose. No cough. There is some postnasal drip. No fevers. She will take Sudafed and her symptoms will improve though they will come back. She's also taking Zyrtec and using a Nettie pot.  Active Ambulatory Problems    Diagnosis Date Noted  . Asthma 03/15/2012  . GERD (gastroesophageal reflux disease) 03/15/2012  . Gastroparesis 09/18/2012  . Encounter for general adult medical examination with abnormal findings 07/21/2014  . Hypertriglyceridemia 07/21/2014  . Urinary urgency 04/07/2016  . Cervical polyp 04/07/2016  . Allergic rhinitis 12/05/2016   Resolved Ambulatory Problems    Diagnosis Date Noted  . Sinusitis 03/15/2012  . General medical exam 03/15/2012  . Asthma with acute exacerbation 03/18/2013   Past Medical History:  Diagnosis Date  . Allergic rhinitis   . Asthma   . Depression   . GERD (gastroesophageal reflux disease)   . History of chicken pox     Family History  Problem Relation Age of Onset  . Hyperlipidemia Mother   . Rheum arthritis Father   . Arthritis Father   . Asthma Brother   . Hyperlipidemia Brother   . Depression Maternal Aunt        Bi-polar  . Cancer Maternal Uncle        Lung Ca - 70's  . Diabetes  Paternal Aunt   . Diabetes Paternal Uncle   . Stroke Maternal Grandmother   . Cancer Maternal Grandfather        Colon & prostate - 74's  . Colon cancer Maternal Grandfather   . Diabetes Paternal Grandmother     Social History   Social History  . Marital status: Married    Spouse name: N/A  . Number of children: 2  . Years of education: N/A   Occupational History  . Office assistant    Social History Main Topics  . Smoking status: Never Smoker  . Smokeless tobacco: Never Used  . Alcohol use Yes     Comment: Occasional  . Drug use: No  . Sexual activity: Yes    Birth control/ protection: Surgical     Comment: Partner had vasectomy    Other Topics Concern  . Not on file   Social History Narrative   Lives in [place] with husband and 2 children. Works at [employer].      Regular Exercise -  Walk 2 to 3 times a week, 1.5 miles   Daily Caffeine Use:  Diet coke 2 to 3 16oz bottles                 ROS  General:  Negative for nexplained weight loss, fever Skin: Negative for new or changing mole, sore that won't heal HEENT: Positive for trouble hearing, negative for trouble seeing, ringing  in ears, mouth sores, hoarseness, change in voice, dysphagia. CV:  Negative for chest pain, dyspnea, edema, palpitations Resp: Negative for cough, dyspnea, hemoptysis GI: Negative for nausea, vomiting, diarrhea, constipation, abdominal pain, melena, hematochezia. GU: Negative for dysuria, incontinence, urinary hesitance, hematuria, vaginal or penile discharge, polyuria, sexual difficulty, lumps in testicle or breasts MSK: Negative for muscle cramps or aches, joint pain or swelling Neuro: Negative for headaches, weakness, numbness, dizziness, passing out/fainting Psych: Negative for depression, anxiety, memory problems  Objective  Physical Exam Vitals:   06/05/17 0800  BP: 116/72  Pulse: 98  Temp: 98.4 F (36.9 C)    BP Readings from Last 3 Encounters:  06/05/17  116/72  12/05/16 106/62  06/02/16 101/68   Wt Readings from Last 3 Encounters:  06/05/17 147 lb 3.2 oz (66.8 kg)  12/05/16 178 lb 3.2 oz (80.8 kg)  06/02/16 168 lb 14.4 oz (76.6 kg)    Physical Exam  Constitutional: No distress.  HENT:  Head: Normocephalic and atraumatic.  Mouth/Throat: Oropharynx is clear and moist. No oropharyngeal exudate.  Normal TMs bilaterally  Eyes: Conjunctivae are normal. Pupils are equal, round, and reactive to light.  Neck: Neck supple.  Cardiovascular: Normal rate, regular rhythm and normal heart sounds.   Pulmonary/Chest: Effort normal and breath sounds normal.  Abdominal: Soft. Bowel sounds are normal. She exhibits no distension. There is no tenderness. There is no rebound and no guarding.  Genitourinary:  Genitourinary Comments: Bilateral breasts no skin changes, masses, or nipple inversion, no axillary masses bilaterally  Musculoskeletal: She exhibits no edema.  Lymphadenopathy:    She has no cervical adenopathy.  Neurological: She is alert. Gait normal.  Skin: Skin is warm and dry. She is not diaphoretic.  Psychiatric: Mood and affect normal.    Assessment/Plan:   Encounter for general adult medical examination with abnormal findings Physical exam completed. Patient has lost a fairly significant amount of weight through diet and exercise. I encouraged her to continue this as she is still slightly in the overweight category. She has a goal of 135 pounds. Tetanus shot given today. Pap smear normal last year and discussed recommended frequency of Pap smears with patient. She declines HIV testing. Suspect congestion and is related to viral illness and/or allergies. Doubt bacterial illness. She'll monitor for the next 3 or so days and if not improving she'll contact us.   Orders Placed This Encounter  Procedures  . Tdap vaccine greater than or equal to 7yo IM  . Lipid Profile  . Comp Met (CMET)  . TSH  . CBC  . Vitamin D (25 hydroxy)  . HgB  A1c    No orders of the defined types were placed in this encounter.    Tommi Rumps, MD [place]

## 2017-06-05 NOTE — Assessment & Plan Note (Signed)
Physical exam completed. Patient has lost a fairly significant amount of weight through diet and exercise. I encouraged her to continue this as she is still slightly in the overweight category. She has a goal of 135 pounds. Tetanus shot given today. Pap smear normal last year and discussed recommended frequency of Pap smears with patient. She declines HIV testing. Suspect congestion and is related to viral illness and/or allergies. Doubt bacterial illness. She'll monitor for the next 3 or so days and if not improving she'll contact us.

## 2017-06-09 ENCOUNTER — Telehealth: Payer: Self-pay | Admitting: *Deleted

## 2017-06-09 NOTE — Telephone Encounter (Signed)
Please advise 

## 2017-06-09 NOTE — Telephone Encounter (Signed)
Patient was advised to call office if her sinus issues failed to get better. Pt was last seen on Monday, pt continues with the same symptoms and has done all that was advised by Dr. Birdie SonsSonnenberg . Pt contact [phone]

## 2017-06-10 MED ORDER — AMOXICILLIN-POT CLAVULANATE 875-125 MG PO TABS: 1.0000 | ORAL_TABLET | Freq: Two times a day (BID) | ORAL | 0 refills | Status: DC

## 2017-06-10 NOTE — Telephone Encounter (Signed)
Spoke with patient regarding her symptoms not improving. She still reports significant sinus congestion and pressure. She's been attempting nasal saline rinses and continuing to use allergy medications with no benefit. She reports she's not able to get a lot out of her nose. Given persistent symptoms we will cover with Augmentin given that this is likely bacterial with the duration of illness. Discussed eating yogurt or taking a probiotic with this. If not improving within 4-5 days of the antibiotic she'll follow-up in the office.

## 2017-06-10 NOTE — Telephone Encounter (Signed)
Attempted to contact patient regarding symptoms. There is no answer. I left a voicemail asking her to call back to the office.

## 2017-07-26 ENCOUNTER — Ambulatory Visit (INDEPENDENT_AMBULATORY_CARE_PROVIDER_SITE_OTHER): Payer: BC Managed Care – PPO | Admitting: Family Medicine

## 2017-07-26 DIAGNOSIS — J0101 Acute recurrent maxillary sinusitis: Secondary | ICD-10-CM

## 2017-07-26 DIAGNOSIS — J45909 Unspecified asthma, uncomplicated: Secondary | ICD-10-CM | POA: Diagnosis not present

## 2017-07-26 DIAGNOSIS — J329 Chronic sinusitis, unspecified: Secondary | ICD-10-CM | POA: Insufficient documentation

## 2017-07-26 MED ORDER — ALBUTEROL SULFATE HFA 108 (90 BASE) MCG/ACT IN AERS: 2.0000 | INHALATION_SPRAY | Freq: Four times a day (QID) | RESPIRATORY_TRACT | 3 refills | Status: DC | PRN

## 2017-07-26 MED ORDER — DOXYCYCLINE HYCLATE 100 MG PO TABS: 100.0000 mg | ORAL_TABLET | Freq: Two times a day (BID) | ORAL | 0 refills | Status: DC

## 2017-07-26 MED ORDER — FLUCONAZOLE 150 MG PO TABS: 150.0000 mg | ORAL_TABLET | Freq: Once | ORAL | 0 refills | Status: AC

## 2017-07-26 NOTE — Progress Notes (Signed)
  Tanya AlarEric Sonnenberg, MD Phone: [phone]  Tanya Kramer is a 51 y.o. female who presents today for same-day visit.  Patient notes she had return of her sinus symptoms. She notes for the last 3-4 weeks her maxillary sinus on the left side and her left ear has felt clogged up and felt as though there is significant pressure in them. Notes the right ear started to feel clogged up. She's not able get a lot out of her nose. She notes no fevers. She does note postnasal drip. No sneezing or itchy watery eyes. She's been trying Flonase, Zyrtec, Nettie pot with no benefit. She was treated with Augmentin in June and did have improvement of her symptoms with the antibiotics though they returned shortly after stopping the antibiotics.  PMH: nonsmoker.   ROS see history of present illness  Objective  Physical Exam Vitals:   07/26/17 0833  BP: 104/62  Pulse: 91  Resp: 16  Temp: 98.3 F (36.8 C)    BP Readings from Last 3 Encounters:  07/26/17 104/62  06/05/17 116/72  12/05/16 106/62   Wt Readings from Last 3 Encounters:  07/26/17 139 lb (63 kg)  06/05/17 147 lb 3.2 oz (66.8 kg)  12/05/16 178 lb 3.2 oz (80.8 kg)    Physical Exam  Constitutional: No distress.  HENT:  Head: Normocephalic and atraumatic.  Mouth/Throat: Oropharynx is clear and moist. No oropharyngeal exudate.  Normal TMs, no sinus tenderness to percussion  Eyes: Pupils are equal, round, and reactive to light. Conjunctivae are normal.  Cardiovascular: Normal rate, regular rhythm and normal heart sounds.   Pulmonary/Chest: Effort normal and breath sounds normal.  Neurological: She is alert. Gait normal.  Skin: She is not diaphoretic.     Assessment/Plan: Please see individual problem list.  Sinusitis Patient with recurrent sinusitis. We'll treat with doxycycline given that Augmentin did not completely resolve the infection. She is prescribed Diflucan to take if needed for yeast infection given that she'll be on  antibiotics. She'll continue Flonase and Zyrtec. Given return precautions.   No orders of the defined types were placed in this encounter.   Meds ordered this encounter  Medications  . fluticasone (FLONASE) 50 MCG/ACT nasal spray    Sig: Place 2 sprays into both nostrils daily.  Marland Kitchen. albuterol (PROAIR HFA) 108 (90 Base) MCG/ACT inhaler    Sig: Inhale 2 puffs into the lungs every 6 (six) hours as needed.    Dispense:  1 Inhaler    Refill:  3  . doxycycline (VIBRA-TABS) 100 MG tablet    Sig: Take 1 tablet (100 mg total) by mouth 2 (two) times daily.    Dispense:  14 tablet    Refill:  0  . fluconazole (DIFLUCAN) 150 MG tablet    Sig: Take 1 tablet (150 mg total) by mouth once.    Dispense:  1 tablet    Refill:  0   Tanya AlarEric Sonnenberg, MD [facility] [place]

## 2017-07-26 NOTE — Assessment & Plan Note (Signed)
Patient with recurrent sinusitis. We'll treat with doxycycline given that Augmentin did not completely resolve the infection. She is prescribed Diflucan to take if needed for yeast infection given that she'll be on antibiotics. She'll continue Flonase and Zyrtec. Given return precautions.

## 2017-07-26 NOTE — Patient Instructions (Signed)
Nice to see you. We are going to treat you with doxycycline for a sinus infection. Please be wary of being out in the sun as this could cause some skin sensitivity. Please also take a probiotic or eat some yogurt while taking this. You should avoid dairy products 4 hours before and 4 hours after you take the doxycycline. If your symptoms worsen or you develop new symptoms or your symptoms are not improving please let us know.

## 2017-08-07 ENCOUNTER — Telehealth: Payer: Self-pay | Admitting: Family Medicine

## 2017-08-07 MED ORDER — AMOXICILLIN-POT CLAVULANATE 875-125 MG PO TABS: 1.0000 | ORAL_TABLET | Freq: Two times a day (BID) | ORAL | 0 refills | Status: DC

## 2017-08-07 NOTE — Telephone Encounter (Signed)
Pt saw Dr. Birdie Sons about week and half ago. Pt was put an antibiotic. She is still not feeling better. Please call her at [phone].

## 2017-08-07 NOTE — Telephone Encounter (Signed)
Please see if she's having any fevers, cough productive of blood, trouble breathing, or any new symptoms. If she's having any of these she will need to be evaluated again. If her symptoms are the same as prior can try her on Augmentin as she had a good benefit with that previously.

## 2017-08-07 NOTE — Telephone Encounter (Signed)
Please advise 

## 2017-08-07 NOTE — Telephone Encounter (Signed)
Patient states she does not have a fever, cough or trouble breathing.  [facility]

## 2017-08-07 NOTE — Telephone Encounter (Signed)
Augmentin sent to her pharmacy.

## 2017-08-08 NOTE — Telephone Encounter (Signed)
Patient aware.

## 2017-08-15 ENCOUNTER — Telehealth: Payer: Self-pay | Admitting: Family Medicine

## 2017-08-15 NOTE — Telephone Encounter (Signed)
Please advise 

## 2017-08-15 NOTE — Telephone Encounter (Signed)
Looks like it's been about 3 weeks since the patient was evaluated. If she's continued to have symptoms at this time I would suggest reevaluation to determine if she needs an alternative antibiotic or some other treatment. Thanks.

## 2017-08-15 NOTE — Telephone Encounter (Signed)
Pt called back returning your call. Please advise, thank you !  Call pt @ [phone]

## 2017-08-15 NOTE — Telephone Encounter (Signed)
Pt called about still having some pain with her left ear. The medication that the pt is on is not working per pt. Pt would like to know if something else can be given? Please advise?  Call pt @ [phone]. Thank you!

## 2017-08-15 NOTE — Telephone Encounter (Signed)
Left message to return call 

## 2017-08-16 ENCOUNTER — Encounter: Payer: Self-pay | Admitting: Family Medicine

## 2017-08-16 ENCOUNTER — Ambulatory Visit (INDEPENDENT_AMBULATORY_CARE_PROVIDER_SITE_OTHER): Payer: BC Managed Care – PPO | Admitting: Family Medicine

## 2017-08-16 DIAGNOSIS — J0101 Acute recurrent maxillary sinusitis: Secondary | ICD-10-CM | POA: Diagnosis not present

## 2017-08-16 MED ORDER — LEVOFLOXACIN 750 MG PO TABS: 750.0000 mg | ORAL_TABLET | Freq: Every day | ORAL | 0 refills | Status: DC

## 2017-08-16 MED ORDER — DOXYCYCLINE HYCLATE 100 MG PO TABS: 100.0000 mg | ORAL_TABLET | Freq: Two times a day (BID) | ORAL | 0 refills | Status: DC

## 2017-08-16 NOTE — Progress Notes (Signed)
  Marikay AlarEric Nyimah Shadduck, MD Phone: [phone]  Paul DykesLisa B Isip is a 51 y.o. female who presents today for same-day visit.  Patient seen 3 weeks ago for sinusitis. Notes she did not get any better with the Augmentin. Continues to have bilateral maxillary sinus pressure. Notes left ear pressure greater than right ear pressure. She continues to have postnasal drip. No fevers. No cough. She's been trying Sudafed, over-the-counter sinus medication, Zyrtec, Flonase, and nasal rinses. Patient has had 2 courses of Augmentin with which she had improvement during the first course though not most recent course. She did not improve with the doxycycline.  ROS see history of present illness  Objective  Physical Exam Vitals:   08/16/17 0902  BP: 108/72  Pulse: 71  Temp: 97.9 F (36.6 C)  SpO2: 98%    BP Readings from Last 3 Encounters:  08/16/17 108/72  07/26/17 104/62  06/05/17 116/72   Wt Readings from Last 3 Encounters:  08/16/17 138 lb 8 oz (62.8 kg)  07/26/17 139 lb (63 kg)  06/05/17 147 lb 3.2 oz (66.8 kg)    Physical Exam  Constitutional: No distress.  HENT:  Head: Normocephalic and atraumatic.  Mouth/Throat: Oropharynx is clear and moist. No oropharyngeal exudate.  Normal TMs bilaterally, bilateral maxillary sinuses tender to percussion  Eyes: Pupils are equal, round, and reactive to light. Conjunctivae are normal.  Cardiovascular: Normal rate, regular rhythm and normal heart sounds.   Pulmonary/Chest: Effort normal and breath sounds normal.  Skin: She is not diaphoretic.     Assessment/Plan: Please see individual problem list.  Sinusitis Continues to have issues with recurrent sinusitis. Has not responded to doxycycline or Augmentin. Initially doxycycline was sent to her pharmacy though she brought up that she did not respond to this earlier. I sent in Levaquin for her to take. Discussed risk of neuropathy and tendon rupture. She'll continue supportive care as well. If not  improving with this she needs to see ENT.   No orders of the defined types were placed in this encounter.   Meds ordered this encounter  Medications  . DISCONTD: doxycycline (VIBRA-TABS) 100 MG tablet    Sig: Take 1 tablet (100 mg total) by mouth 2 (two) times daily.    Dispense:  14 tablet    Refill:  0  . levofloxacin (LEVAQUIN) 750 MG tablet    Sig: Take 1 tablet (750 mg total) by mouth daily.    Dispense:  5 tablet    Refill:  0   Marikay AlarEric Yukiko Minnich, MD [facility] [place] [place]

## 2017-08-16 NOTE — Telephone Encounter (Signed)
Patient scheduled.

## 2017-08-16 NOTE — Patient Instructions (Addendum)
Nice to see you. We are going to treat you for sinusitis with doxycycline. If you don't notice a difference by Friday please let us know and we will send in Levaquin for you to try. If you develop fevers or any new or changing symptoms please be reevaluated.

## 2017-08-16 NOTE — Assessment & Plan Note (Signed)
Continues to have issues with recurrent sinusitis. Has not responded to doxycycline or Augmentin. Initially doxycycline was sent to her pharmacy though she brought up that she did not respond to this earlier. I sent in Levaquin for her to take. Discussed risk of neuropathy and tendon rupture. She'll continue supportive care as well. If not improving with this she needs to see ENT.

## 2017-08-31 DIAGNOSIS — J320 Chronic maxillary sinusitis: Secondary | ICD-10-CM | POA: Diagnosis not present

## 2017-08-31 DIAGNOSIS — J301 Allergic rhinitis due to pollen: Secondary | ICD-10-CM | POA: Diagnosis not present

## 2017-09-14 DIAGNOSIS — J301 Allergic rhinitis due to pollen: Secondary | ICD-10-CM | POA: Diagnosis not present

## 2017-09-14 DIAGNOSIS — M26609 Unspecified temporomandibular joint disorder, unspecified side: Secondary | ICD-10-CM | POA: Diagnosis not present

## 2017-09-14 DIAGNOSIS — R51 Headache: Secondary | ICD-10-CM | POA: Diagnosis not present

## 2018-03-28 DIAGNOSIS — L820 Inflamed seborrheic keratosis: Secondary | ICD-10-CM | POA: Diagnosis not present

## 2018-03-28 DIAGNOSIS — L578 Other skin changes due to chronic exposure to nonionizing radiation: Secondary | ICD-10-CM | POA: Diagnosis not present

## 2018-03-28 DIAGNOSIS — L821 Other seborrheic keratosis: Secondary | ICD-10-CM | POA: Diagnosis not present

## 2018-03-28 DIAGNOSIS — L812 Freckles: Secondary | ICD-10-CM | POA: Diagnosis not present

## 2018-06-06 ENCOUNTER — Encounter: Payer: Self-pay | Admitting: Family Medicine

## 2018-06-06 ENCOUNTER — Ambulatory Visit (INDEPENDENT_AMBULATORY_CARE_PROVIDER_SITE_OTHER): Payer: BC Managed Care – PPO | Admitting: Family Medicine

## 2018-06-06 VITALS — BP 100/70 | HR 85 | Temp 98.0°F | Ht 64.0 in | Wt 131.2 lb

## 2018-06-06 DIAGNOSIS — Z0001 Encounter for general adult medical examination with abnormal findings: Secondary | ICD-10-CM

## 2018-06-06 DIAGNOSIS — Z1329 Encounter for screening for other suspected endocrine disorder: Secondary | ICD-10-CM

## 2018-06-06 DIAGNOSIS — N841 Polyp of cervix uteri: Secondary | ICD-10-CM

## 2018-06-06 DIAGNOSIS — I8392 Asymptomatic varicose veins of left lower extremity: Secondary | ICD-10-CM | POA: Diagnosis not present

## 2018-06-06 DIAGNOSIS — Z1231 Encounter for screening mammogram for malignant neoplasm of breast: Secondary | ICD-10-CM

## 2018-06-06 DIAGNOSIS — Z130 Encounter for screening for diseases of the blood and blood-forming organs and certain disorders involving the immune mechanism: Secondary | ICD-10-CM | POA: Diagnosis not present

## 2018-06-06 DIAGNOSIS — Z13220 Encounter for screening for lipoid disorders: Secondary | ICD-10-CM | POA: Diagnosis not present

## 2018-06-06 DIAGNOSIS — E559 Vitamin D deficiency, unspecified: Secondary | ICD-10-CM

## 2018-06-06 DIAGNOSIS — Z1239 Encounter for other screening for malignant neoplasm of breast: Secondary | ICD-10-CM

## 2018-06-06 LAB — COMPREHENSIVE METABOLIC PANEL
ALBUMIN: 4.4 g/dL (ref 3.5–5.2)
ALK PHOS: 87 U/L (ref 39–117)
ALT: 31 U/L (ref 0–35)
AST: 20 U/L (ref 0–37)
BUN: 11 mg/dL (ref 6–23)
CO2: 29 mEq/L (ref 19–32)
Calcium: 9.7 mg/dL (ref 8.4–10.5)
Chloride: 104 mEq/L (ref 96–112)
Creatinine, Ser: 0.7 mg/dL (ref 0.40–1.20)
GFR: 94.14 mL/min (ref >60.00)
Glucose, Bld: 105 mg/dL — ABNORMAL HIGH (ref 70–99)
POTASSIUM: 4.3 meq/L (ref 3.5–5.1)
Sodium: 141 mEq/L (ref 135–145)
TOTAL PROTEIN: 7.5 g/dL (ref 6.0–8.3)
Total Bilirubin: 0.7 mg/dL (ref 0.2–1.2)

## 2018-06-06 LAB — LIPID PANEL
CHOLESTEROL: 183 mg/dL (ref 0–200)
HDL: 59.4 mg/dL (ref >39.00)
LDL Cholesterol: 98 mg/dL (ref 0–99)
NonHDL: 123.82
TRIGLYCERIDES: 131 mg/dL (ref 0.0–149.0)
Total CHOL/HDL Ratio: 3
VLDL: 26.2 mg/dL (ref 0.0–40.0)

## 2018-06-06 LAB — CBC
HCT: 39.8 % (ref 36.0–46.0)
Hemoglobin: 13.5 g/dL (ref 12.0–15.0)
MCHC: 33.9 g/dL (ref 30.0–36.0)
MCV: 90.7 fl (ref 78.0–100.0)
PLATELETS: 277 10*3/uL (ref 150.0–400.0)
RBC: 4.39 Mil/uL (ref 3.87–5.11)
RDW: 12.3 % (ref 11.5–15.5)
WBC: 11 10*3/uL — ABNORMAL HIGH (ref 4.0–10.5)

## 2018-06-06 LAB — TSH: TSH: 2.73 u[IU]/mL (ref 0.35–4.50)

## 2018-06-06 LAB — VITAMIN D 25 HYDROXY (VIT D DEFICIENCY, FRACTURES): VITD: 25.35 ng/mL — AB (ref 30.00–100.00)

## 2018-06-06 NOTE — Assessment & Plan Note (Signed)
Patient saw gynecology for this.  This was a benign finding based on pathology report.  Based on notes from GYN no further follow-up was needed for this.

## 2018-06-06 NOTE — Assessment & Plan Note (Signed)
Refer to vascular surgery. 

## 2018-06-06 NOTE — Progress Notes (Signed)
Tommi Rumps, MD Phone: [phone]  Tanya Kramer is a 52 y.o. female who presents today for cpe.  Exercises by walking 3 to 4 days a week for 45 minutes at a time. Diet consists of lots of vegetables and meats and cheeses.  No sweet tea.  Occasional Diet Coke. Pap smear 04/07/2016 with negative cells and negative HPV. Mammogram scheduled today. Family history of colon cancer in her grandfather though not in any first-degree relatives.  No family history of breast or ovarian cancer. Tdap is up-to-date. No tobacco use or illicit drug use.  Occasional alcohol use. She sees a Pharmacist, community once a year.  Ophthalmologist once a year.  She does report her left pupil is greater than in size in her right pupil since high school.  The ophthalmologist is aware of this.  She has a prominent vein that intermittently pops up in her left lateral leg from mid thigh down to mid shin.  Is been going on for 3 months.  No pain.  Its getting slightly worse.  No swelling associated.  Active Ambulatory Problems    Diagnosis Date Noted  . Asthma 03/15/2012  . GERD (gastroesophageal reflux disease) 03/15/2012  . Gastroparesis 09/18/2012  . Encounter for general adult medical examination with abnormal findings 07/21/2014  . Hypertriglyceridemia 07/21/2014  . Allergic rhinitis 12/05/2016  . Asymptomatic varicose veins of left lower extremity 06/06/2018   Resolved Ambulatory Problems    Diagnosis Date Noted  . Sinusitis 03/15/2012  . General medical exam 03/15/2012  . Asthma with acute exacerbation 03/18/2013  . Urinary urgency 04/07/2016  . Cervical polyp 04/07/2016  . Sinusitis 07/26/2017   Past Medical History:  Diagnosis Date  . Allergic rhinitis   . Asthma   . Cervical polyp 04/07/2016  . Depression   . GERD (gastroesophageal reflux disease)   . History of chicken pox     Family History  Problem Relation Age of Onset  . Hyperlipidemia Mother   . Rheum arthritis Father   . Arthritis Father    . Asthma Brother   . Hyperlipidemia Brother   . Depression Maternal Aunt        Bi-polar  . Cancer Maternal Uncle        Lung Ca - 70's  . Diabetes Paternal Aunt   . Diabetes Paternal Uncle   . Stroke Maternal Grandmother   . Cancer Maternal Grandfather        Colon & prostate - 57's  . Colon cancer Maternal Grandfather   . Diabetes Paternal Grandmother     Social History   Socioeconomic History  . Marital status: Married    Spouse name: Not on file  . Number of children: 2  . Years of education: Not on file  . Highest education level: Not on file  Occupational History  . Occupation: Surveyor, minerals  Social Needs  . Financial resource strain: Not on file  . Food insecurity:    Worry: Not on file    Inability: Not on file  . Transportation needs:    Medical: Not on file    Non-medical: Not on file  Tobacco Use  . Smoking status: Never Smoker  . Smokeless tobacco: Never Used  Substance and Sexual Activity  . Alcohol use: Yes    Comment: Occasional  . Drug use: No  . Sexual activity: Yes    Birth control/protection: Surgical    Comment: Partner had vasectomy   Lifestyle  . Physical activity:    Days per week:  Not on file    Minutes per session: Not on file  . Stress: Not on file  Relationships  . Social connections:    Talks on phone: Not on file    Gets together: Not on file    Attends religious service: Not on file    Active member of club or organization: Not on file    Attends meetings of clubs or organizations: Not on file    Relationship status: Not on file  . Intimate partner violence:    Fear of current or ex partner: Not on file    Emotionally abused: Not on file    Physically abused: Not on file    Forced sexual activity: Not on file  Other Topics Concern  . Not on file  Social History Narrative   Lives in [place] with husband and 2 children. Works at [employer].      Regular Exercise -  Walk 2 to 3 times a week, 1.5 miles   Daily  Caffeine Use:  Diet coke 2 to 3 16oz bottles                 ROS  General:  Negative for nexplained weight loss, fever Skin: Negative for new or changing mole, sore that won't heal HEENT: Negative for trouble hearing, trouble seeing, ringing in ears, mouth sores, hoarseness, change in voice, dysphagia. CV:  Negative for chest pain, dyspnea, edema, palpitations Resp: Negative for cough, dyspnea, hemoptysis GI: Negative for nausea, vomiting, diarrhea, constipation, abdominal pain, melena, hematochezia. GU: Negative for dysuria, incontinence, urinary hesitance, hematuria, vaginal or penile discharge, polyuria, sexual difficulty, lumps in testicle or breasts MSK: Negative for muscle cramps or aches, joint pain or swelling Neuro: Negative for headaches, weakness, numbness, dizziness, passing out/fainting Psych: Negative for depression, anxiety, memory problems  Objective  Physical Exam Vitals:   06/06/18 0759  BP: 100/70  Pulse: 85  Temp: 98 F (36.7 C)  SpO2: 98%    BP Readings from Last 3 Encounters:  06/06/18 100/70  08/16/17 108/72  07/26/17 104/62   Wt Readings from Last 3 Encounters:  06/06/18 131 lb 3.2 oz (59.5 kg)  08/16/17 138 lb 8 oz (62.8 kg)  07/26/17 139 lb (63 kg)    Physical Exam  Constitutional: No distress.  HENT:  Head: Normocephalic and atraumatic.  Mouth/Throat: Oropharynx is clear and moist.  Eyes: Conjunctivae are normal.  Left pupil slightly greater than right pupil  Neck: Neck supple.  Cardiovascular: Normal rate, regular rhythm and normal heart sounds.  Pulmonary/Chest: Effort normal and breath sounds normal.  Abdominal: Soft. Bowel sounds are normal. She exhibits no distension and no mass. There is no tenderness.  Genitourinary:  Genitourinary Comments: Chaperone used, bilateral breasts with no skin changes, nipple inversion, tenderness, or masses, no masses in bilateral axilla  Musculoskeletal: She exhibits no edema.  Lymphadenopathy:     She has no cervical adenopathy.  Neurological: She is alert.  Skin: Skin is warm and dry. She is not diaphoretic.  Psychiatric: She has a normal mood and affect.  No obvious varicose veins noted today on exam. 2+ DP pulses bilaterally  Assessment/Plan:   Encounter for general adult medical examination with abnormal findings Physical exam completed.  Patient has done quite well with diet and exercise and weight loss.  Reports weight has been stable for some time now.  Pap smear is up-to-date.  Discussed current guidelines recommend Pap smear every 5 years with negative cells and negative HPV.  Consider pelvic  exam next year.  Mammogram is scheduled.  Lab work as outlined below.  Cervical polyp Patient saw gynecology for this.  This was a benign finding based on pathology report.  Based on notes from GYN no further follow-up was needed for this.  Asymptomatic varicose veins of left lower extremity Refer to vascular surgery.   Orders Placed This Encounter  Procedures  . MM 3D SCREEN BREAST BILATERAL    Standing Status:   Future    Standing Expiration Date:   08/07/2019    Order Specific Question:   Reason for Exam (SYMPTOM  OR DIAGNOSIS REQUIRED)    Answer:   screening    Order Specific Question:   Is the patient pregnant?    Answer:   No    Order Specific Question:   Preferred imaging location?    Answer:   [facility]  . Lipid panel  . Comp Met (CMET)  . Vitamin D (25 hydroxy)  . CBC  . TSH  . Ambulatory referral to Vascular Surgery    Referral Priority:   Routine    Referral Type:   Surgical    Referral Reason:   Specialty Services Required    Requested Specialty:   Vascular Surgery    Number of Visits Requested:   1    No orders of the defined types were placed in this encounter.    Tommi Rumps, MD [place]

## 2018-06-06 NOTE — Assessment & Plan Note (Addendum)
Physical exam completed.  Patient has done quite well with diet and exercise and weight loss.  Reports weight has been stable for some time now.  Pap smear is up-to-date.  Discussed current guidelines recommend Pap smear every 5 years with negative cells and negative HPV.  Consider pelvic exam next year.  Mammogram is scheduled.  Lab work as outlined below.

## 2018-06-06 NOTE — Patient Instructions (Signed)
Nice to see you. Please continue to work on diet and exercise. We will get you to see vascular surgery for the enlarged vein in your leg. We will contact you with your lab results.

## 2018-06-08 ENCOUNTER — Other Ambulatory Visit: Payer: Self-pay | Admitting: Family Medicine

## 2018-06-08 DIAGNOSIS — D72829 Elevated white blood cell count, unspecified: Secondary | ICD-10-CM

## 2018-06-15 ENCOUNTER — Other Ambulatory Visit (INDEPENDENT_AMBULATORY_CARE_PROVIDER_SITE_OTHER): Payer: BC Managed Care – PPO

## 2018-06-15 DIAGNOSIS — D72829 Elevated white blood cell count, unspecified: Secondary | ICD-10-CM | POA: Diagnosis not present

## 2018-06-15 LAB — CBC WITH DIFFERENTIAL/PLATELET
BASOS ABS: 0.1 10*3/uL (ref 0.0–0.1)
Basophils Relative: 1 % (ref 0.0–3.0)
Eosinophils Absolute: 0.6 10*3/uL (ref 0.0–0.7)
Eosinophils Relative: 6.3 % — ABNORMAL HIGH (ref 0.0–5.0)
HEMATOCRIT: 37.4 % (ref 36.0–46.0)
HEMOGLOBIN: 12.8 g/dL (ref 12.0–15.0)
LYMPHS PCT: 29.8 % (ref 12.0–46.0)
Lymphs Abs: 2.6 10*3/uL (ref 0.7–4.0)
MCHC: 34.4 g/dL (ref 30.0–36.0)
MCV: 91.3 fl (ref 78.0–100.0)
MONOS PCT: 6.8 % (ref 3.0–12.0)
Monocytes Absolute: 0.6 10*3/uL (ref 0.1–1.0)
NEUTROS ABS: 5 10*3/uL (ref 1.4–7.7)
Neutrophils Relative %: 56.1 % (ref 43.0–77.0)
PLATELETS: 261 10*3/uL (ref 150.0–400.0)
RBC: 4.09 Mil/uL (ref 3.87–5.11)
RDW: 12.4 % (ref 11.5–15.5)
WBC: 8.9 10*3/uL (ref 4.0–10.5)

## 2018-06-20 ENCOUNTER — Ambulatory Visit: Payer: Self-pay

## 2018-06-20 NOTE — Telephone Encounter (Signed)
Pt. Reports she started coughing 1 week ago and it is "getting worse." [place] Requesting an appointment for next week when she is in town. Is noticing "some wheezing." Productive cough with clear phlegm.No availability in the office next week.Advised pt. [place]. States will be seen "if I feel worse."  Pt. Asking to be worked in next week. Contact number [phone].  Reason for Disposition . Cough has been present for > 3 weeks  Answer Assessment - Initial Assessment Questions 1. ONSET: "When did the cough begin?"      Started 1week ago 2. SEVERITY: "How bad is the cough today?"      Not too bad 3. RESPIRATORY DISTRESS: "Describe your breathing."      Some shortness of breath 4. FEVER: "Do you have a fever?" If so, ask: "What is your temperature, how was it measured, and when did it start?"     No 5. SPUTUM: "Describe the color of your sputum" (clear, white, yellow, green)     Clear 6. HEMOPTYSIS: "Are you coughing up any blood?" If so ask: "How much?" (flecks, streaks, tablespoons, etc.)     No 7. CARDIAC HISTORY: "Do you have any history of heart disease?" (e.g., heart attack, congestive heart failure)      No 8. LUNG HISTORY: "Do you have any history of lung disease?"  (e.g., pulmonary embolus, asthma, emphysema)     Asthma 9. PE RISK FACTORS: "Do you have a history of blood clots?" (or: recent major surgery, recent prolonged travel, bedridden)     No 10. OTHER SYMPTOMS: "Do you have any other symptoms?" (e.g., runny nose, wheezing, chest pain)       Chest tightness and wheezing 11. PREGNANCY: "Is there any chance you are pregnant?" "When was your last menstrual period?"       No 12. TRAVEL: "Have you traveled out of the country in the last month?" (e.g., travel history, exposures)       No  Protocols used: COUGH - ACUTE PRODUCTIVE-A-AH

## 2018-06-20 NOTE — Telephone Encounter (Signed)
4:30 on Monday is ok.

## 2018-06-20 NOTE — Telephone Encounter (Signed)
Patient is scheduled   

## 2018-06-20 NOTE — Telephone Encounter (Signed)
Please advise 

## 2018-06-25 ENCOUNTER — Ambulatory Visit (INDEPENDENT_AMBULATORY_CARE_PROVIDER_SITE_OTHER): Payer: 59 | Admitting: Vascular Surgery

## 2018-06-25 ENCOUNTER — Encounter (INDEPENDENT_AMBULATORY_CARE_PROVIDER_SITE_OTHER): Payer: Self-pay | Admitting: Vascular Surgery

## 2018-06-25 ENCOUNTER — Ambulatory Visit (INDEPENDENT_AMBULATORY_CARE_PROVIDER_SITE_OTHER): Payer: BC Managed Care – PPO | Admitting: Family Medicine

## 2018-06-25 ENCOUNTER — Encounter: Payer: Self-pay | Admitting: Family Medicine

## 2018-06-25 ENCOUNTER — Telehealth: Payer: Self-pay | Admitting: Family Medicine

## 2018-06-25 ENCOUNTER — Ambulatory Visit
Admission: RE | Admit: 2018-06-25 | Discharge: 2018-06-25 | Disposition: A | Discharge status: Home / Self Care | Payer: BC Managed Care – PPO | Source: Ambulatory Visit | Attending: Family Medicine | Admitting: Family Medicine

## 2018-06-25 VITALS — BP 98/64 | HR 73 | Temp 98.3°F | Wt 133.4 lb

## 2018-06-25 VITALS — BP 102/64 | HR 74 | Resp 12 | Ht 64.0 in | Wt 134.0 lb

## 2018-06-25 DIAGNOSIS — Z1231 Encounter for screening mammogram for malignant neoplasm of breast: Secondary | ICD-10-CM | POA: Diagnosis present

## 2018-06-25 DIAGNOSIS — Z1211 Encounter for screening for malignant neoplasm of colon: Secondary | ICD-10-CM

## 2018-06-25 DIAGNOSIS — M79605 Pain in left leg: Secondary | ICD-10-CM

## 2018-06-25 DIAGNOSIS — I8392 Asymptomatic varicose veins of left lower extremity: Secondary | ICD-10-CM | POA: Diagnosis not present

## 2018-06-25 DIAGNOSIS — J45901 Unspecified asthma with (acute) exacerbation: Secondary | ICD-10-CM | POA: Insufficient documentation

## 2018-06-25 DIAGNOSIS — J4521 Mild intermittent asthma with (acute) exacerbation: Secondary | ICD-10-CM

## 2018-06-25 DIAGNOSIS — R05 Cough: Secondary | ICD-10-CM

## 2018-06-25 DIAGNOSIS — M79604 Pain in right leg: Secondary | ICD-10-CM

## 2018-06-25 DIAGNOSIS — Z1239 Encounter for other screening for malignant neoplasm of breast: Secondary | ICD-10-CM

## 2018-06-25 DIAGNOSIS — R059 Cough, unspecified: Secondary | ICD-10-CM

## 2018-06-25 MED ORDER — PREDNISONE 20 MG PO TABS: 40.0000 mg | ORAL_TABLET | Freq: Every day | ORAL | 0 refills | Status: DC

## 2018-06-25 MED ORDER — ALBUTEROL SULFATE (2.5 MG/3ML) 0.083% IN NEBU
2.5000 mg | INHALATION_SOLUTION | Freq: Once | RESPIRATORY_TRACT | Status: AC
  Administered 2018-06-25: 2.5 mg via RESPIRATORY_TRACT

## 2018-06-25 NOTE — Telephone Encounter (Signed)
Pt would like to get a referral to get a colonoscopy. Please advise?  Call pt @ [phone]. Thank you!

## 2018-06-25 NOTE — Assessment & Plan Note (Signed)
Symptoms most consistent with asthma exacerbation and consistent with prior asthma exacerbations for this patient.  Symptoms did improve with albuterol nebulizer in the office.  We will place her on prednisone.  She will continue her albuterol inhaler.  She is given return precautions.

## 2018-06-25 NOTE — Progress Notes (Signed)
  Marikay AlarEric Terrence Wishon, MD Phone: [phone]  Paul DykesLisa B Frohlich is a 52 y.o. female who presents today for f/u.  CC: cough  She notes 10 days of cough.  Has had a little bit of wheezing as well.  Some tightness consistent with prior asthma exacerbations.  Coughing up clear productive mucus.  No postnasal drip.  No rhinorrhea.  Some wheezing.  Some shortness of breath.  No chest pain.  Notes albuterol is helpful to some degree though the symptoms recur.  Patient has a history of asthma and this feels similar to her exacerbations that she gets when she is sick though is not quite as bad.  Social History   Tobacco Use  Smoking Status Never Smoker  Smokeless Tobacco Never Used     ROS see history of present illness  Objective  Physical Exam Vitals:   06/25/18 1621  BP: 98/64  Pulse: 73  Temp: 98.3 F (36.8 C)  SpO2: 97%    BP Readings from Last 3 Encounters:  06/25/18 98/64  06/25/18 102/64  06/06/18 100/70   Wt Readings from Last 3 Encounters:  06/25/18 133 lb 6.4 oz (60.5 kg)  06/25/18 134 lb (60.8 kg)  06/06/18 131 lb 3.2 oz (59.5 kg)    Physical Exam  Constitutional: No distress.  Cardiovascular: Normal rate, regular rhythm and normal heart sounds.  Pulmonary/Chest: Effort normal. She has no wheezes. She has no rales.  Mildly decreased air movement bilaterally, patient was given a albuterol nebulizer treatment and exam following that revealed improved air movement bilaterally  Musculoskeletal: She exhibits no edema.  Neurological: She is alert.  Skin: Skin is warm and dry. She is not diaphoretic.     Assessment/Plan: Please see individual problem list.  Asthma exacerbation Symptoms most consistent with asthma exacerbation and consistent with prior asthma exacerbations for this patient.  Symptoms did improve with albuterol nebulizer in the office.  We will place her on prednisone.  She will continue her albuterol inhaler.  She is given return precautions.   No  orders of the defined types were placed in this encounter.   Meds ordered this encounter  Medications  . predniSONE (DELTASONE) 20 MG tablet    Sig: Take 2 tablets (40 mg total) by mouth daily with breakfast.    Dispense:  10 tablet    Refill:  0  . albuterol (PROVENTIL) (2.5 MG/3ML) 0.083% nebulizer solution 2.5 mg     Marikay AlarEric Rosezetta Balderston, MD [facility] [place]

## 2018-06-25 NOTE — Progress Notes (Signed)
Subjective:    Patient ID: Tanya Kramer, female    DOB: 1966-02-09, 52 y.o.   MRN: [id number] Chief Complaint  Patient presents with  . New Patient (Initial Visit)    Varicose Veins of Left leg   Presents as a new patient referred by Dr. Birdie Sons for evaluation of painful varicose veins.  The patient endorses a long-standing history of varicosities located to the left lower extremity.  The patient notes a long painful varicosities starting from approximately mid thigh extending down to her calf.  The patient notes that this has progressively become more painful over time.  The patient notes small varicosities to the right lower extremity which also cause her pain.  At this point, the patient has not started engaging conservative therapy including wearing medical grade 1 compression socks or elevating her legs on a daily basis.  The patient notes that her symptoms have progressed to the point that she is unable to function on a daily basis.  She considers her symptoms to be lifestyle limiting.  The patient experiences minimal edema which is also associated with some discomfort.  The patient denies any claudication-like symptoms, rest pain or ulceration to the bilateral lower extremity.  The patient denies any DVT history to the bilateral lower extremity.  Patient denies any recent surgery or trauma to the bilateral lower extremity.  Patient denies any fever, nausea vomiting.  Review of Systems  Constitutional: Negative.   HENT: Negative.   Eyes: Negative.   Respiratory: Negative.   Cardiovascular: Positive for leg swelling.       Painful varicose veins  Gastrointestinal: Negative.   Endocrine: Negative.   Genitourinary: Negative.   Musculoskeletal: Negative.   Skin: Negative.   Allergic/Immunologic: Negative.   Neurological: Negative.   Hematological: Negative.   Psychiatric/Behavioral: Negative.       Objective:   Physical Exam  Constitutional: She is oriented to person, place,  and time. She appears well-developed and well-nourished. No distress.  HENT:  Head: Normocephalic and atraumatic.  Right Ear: External ear normal.  Left Ear: External ear normal.  Eyes: Pupils are equal, round, and reactive to light. Conjunctivae and EOM are normal.  Neck: Normal range of motion.  Cardiovascular: Normal rate, regular rhythm, normal heart sounds and intact distal pulses.  Pulses:      Radial pulses are 2+ on the right side, and 2+ on the left side.       Dorsalis pedis pulses are 2+ on the right side, and 2+ on the left side.       Posterior tibial pulses are 2+ on the right side, and 2+ on the left side.  Left lower extremity : greater than 1 cm varicosities starting approximately mid thigh distally to approximately mid calf.  Scattered less than 1 cm spider veins Right lower extremity: Less than 1 cm scattered varicosities  Pulmonary/Chest: Effort normal and breath sounds normal.  Musculoskeletal: Normal range of motion. She exhibits no edema.  Neurological: She is alert and oriented to person, place, and time.  Skin: Skin is warm and dry. She is not diaphoretic.  Psychiatric: She has a normal mood and affect. Her behavior is normal. Judgment and thought content normal.  Vitals reviewed.  BP 102/64 (BP Location: Left Arm, Patient Position: Sitting)   Pulse 74   Resp 12   Ht 5\' 4"  (1.626 m)   Wt 134 lb (60.8 kg)   LMP 12/19/2016   BMI 23.00 kg/m   Past Medical History:  Diagnosis Date  . Allergic rhinitis   . Asthma   . Cervical polyp 04/07/2016  . Depression   . GERD (gastroesophageal reflux disease)   . History of chicken pox    Social History   Socioeconomic History  . Marital status: Married    Spouse name: Not on file  . Number of children: 2  . Years of education: Not on file  . Highest education level: Not on file  Occupational History  . Occupation: Government social research officerffice assistant  Social Needs  . Financial resource strain: Not on file  . Food insecurity:      Worry: Not on file    Inability: Not on file  . Transportation needs:    Medical: Not on file    Non-medical: Not on file  Tobacco Use  . Smoking status: Never Smoker  . Smokeless tobacco: Never Used  Substance and Sexual Activity  . Alcohol use: Yes    Comment: Occasional  . Drug use: No  . Sexual activity: Yes    Birth control/protection: Surgical    Comment: Partner had vasectomy   Lifestyle  . Physical activity:    Days per week: Not on file    Minutes per session: Not on file  . Stress: Not on file  Relationships  . Social connections:    Talks on phone: Not on file    Gets together: Not on file    Attends religious service: Not on file    Active member of club or organization: Not on file    Attends meetings of clubs or organizations: Not on file    Relationship status: Not on file  . Intimate partner violence:    Fear of current or ex partner: Not on file    Emotionally abused: Not on file    Physically abused: Not on file    Forced sexual activity: Not on file  Other Topics Concern  . Not on file  Social History Narrative   [place] in [place] with husband and 2 children. Works at [employer]      Regular Exercise -  Walk 2 to 3 times a week, 1.5 miles   Daily Caffeine Use:  Diet coke 2 to 3 16oz bottles                Past Surgical History:  Procedure Laterality Date  . CHOLECYSTECTOMY  08/2011   Dr Excell Seltzerooper  . NASAL SINUS SURGERY  2007   Family History  Problem Relation Age of Onset  . Hyperlipidemia Mother   . Rheum arthritis Father   . Arthritis Father   . Asthma Brother   . Hyperlipidemia Brother   . Depression Maternal Aunt        Bi-polar  . Cancer Maternal Uncle        Lung Ca - 70's  . Diabetes Paternal Aunt   . Diabetes Paternal Uncle   . Stroke Maternal Grandmother   . Cancer Maternal Grandfather        Colon & prostate - 70's  . Colon cancer Maternal Grandfather   . Diabetes Paternal Grandmother    No Known  Allergies     Assessment & Plan:  Presents as a new patient referred by Dr. Birdie SonsSonnenberg for evaluation of painful varicose veins.  The patient endorses a long-standing history of varicosities located to the left lower extremity.  The patient notes a long painful varicosities starting from approximately mid thigh extending down to her calf.  The patient notes that  this has progressively become more painful over time.  The patient notes small varicosities to the right lower extremity which also cause her pain.  At this point, the patient has not started engaging conservative therapy including wearing medical grade 1 compression socks or elevating her legs on a daily basis.  The patient notes that her symptoms have progressed to the point that she is unable to function on a daily basis.  She considers her symptoms to be lifestyle limiting.  The patient experiences minimal edema which is also associated with some discomfort.  The patient denies any claudication-like symptoms, rest pain or ulceration to the bilateral lower extremity.  The patient denies any DVT history to the bilateral lower extremity.  Patient denies any recent surgery or trauma to the bilateral lower extremity.  Patient denies any fever, nausea vomiting.  1. Varicose veins of left lower extremity - New The patient was encouraged to wear graduated compression stockings (20-30 mmHg) on a daily basis. The patient was instructed to begin wearing the stockings first thing in the morning and removing them in the evening. The patient was instructed specifically not to sleep in the stockings. Prescription given.  In addition, behavioral modification including elevation during the day will be initiated. Anti-inflammatories for pain. Bring the patient back and have her undergo bilateral venous duplex to rule out any contributing venous versus lymphatic disease The patient will follow up in three months to asses conservative management.  Information on  compression stockings was given to the patient. The patient was instructed to call the office in the interim if any worsening edema or ulcerations to the legs, feet or toes occurs. The patient expresses their understanding.  - VAS [place] LOWER EXTREMITY VENOUS REFLUX; Future  2. Lower extremity pain, bilateral - New As above  - VAS [place] LOWER EXTREMITY VENOUS REFLUX; Future  Current Outpatient Medications on File Prior to Visit  Medication Sig Dispense Refill  . acetaminophen (TYLENOL) 500 MG tablet Take 500 mg by mouth every 6 (six) hours as needed.    Marland Kitchen albuterol (PROAIR HFA) 108 (90 Base) MCG/ACT inhaler Inhale 2 puffs into the lungs every 6 (six) hours as needed. 1 Inhaler 3   No current facility-administered medications on file prior to visit.    There are no Patient Instructions on file for this visit. No follow-ups on file.  Keijuan Schellhase A Tiwanna Tuch, PA-C

## 2018-06-25 NOTE — Patient Instructions (Signed)
Nice to see you. We will treat you with prednisone.  You can continue the albuterol inhaler every 6 hours on a schedule for the next 2 days.  If your symptoms are not improving please let us know. If you develop worsening symptoms, shortness of breath, chest pain, cough productive of blood, or any new or changing symptoms please be evaluated immediately.

## 2018-06-26 NOTE — Telephone Encounter (Signed)
Please advise 

## 2018-06-26 NOTE — Telephone Encounter (Signed)
Referral placed.

## 2018-07-10 ENCOUNTER — Encounter: Payer: Self-pay | Admitting: *Deleted

## 2018-08-28 ENCOUNTER — Ambulatory Visit (INDEPENDENT_AMBULATORY_CARE_PROVIDER_SITE_OTHER): Payer: BC Managed Care – PPO | Admitting: Nurse Practitioner

## 2018-08-28 ENCOUNTER — Encounter (INDEPENDENT_AMBULATORY_CARE_PROVIDER_SITE_OTHER): Payer: BC Managed Care – PPO

## 2018-08-29 ENCOUNTER — Ambulatory Visit (INDEPENDENT_AMBULATORY_CARE_PROVIDER_SITE_OTHER): Payer: BC Managed Care – PPO | Admitting: Vascular Surgery

## 2018-08-29 ENCOUNTER — Encounter (INDEPENDENT_AMBULATORY_CARE_PROVIDER_SITE_OTHER): Payer: Self-pay | Admitting: Vascular Surgery

## 2018-08-29 ENCOUNTER — Ambulatory Visit (INDEPENDENT_AMBULATORY_CARE_PROVIDER_SITE_OTHER): Payer: BC Managed Care – PPO

## 2018-08-29 ENCOUNTER — Encounter (INDEPENDENT_AMBULATORY_CARE_PROVIDER_SITE_OTHER): Payer: BC Managed Care – PPO

## 2018-08-29 VITALS — BP 99/69 | HR 73 | Resp 16 | Ht 64.0 in | Wt 134.4 lb

## 2018-08-29 DIAGNOSIS — M79605 Pain in left leg: Secondary | ICD-10-CM | POA: Diagnosis not present

## 2018-08-29 DIAGNOSIS — I890 Lymphedema, not elsewhere classified: Secondary | ICD-10-CM | POA: Diagnosis not present

## 2018-08-29 DIAGNOSIS — I8392 Asymptomatic varicose veins of left lower extremity: Secondary | ICD-10-CM | POA: Diagnosis not present

## 2018-08-29 DIAGNOSIS — I872 Venous insufficiency (chronic) (peripheral): Secondary | ICD-10-CM

## 2018-08-29 DIAGNOSIS — M79604 Pain in right leg: Secondary | ICD-10-CM

## 2018-08-29 NOTE — Progress Notes (Signed)
Subjective:    Patient ID: Tanya Kramer, female    DOB: 08-17-1966, 52 y.o.   MRN: [id number] Chief Complaint  Patient presents with  . Follow-up    57month bil venous reflux   Patient presents to review vascular studies.  The patient was last seen approximately 3 months ago for a initial work-up of bilateral lower extremity edema painful varicose veins.  Since her initial visit, the patient has been engaging in conservative therapy including wearing medical grade 1 compression socks, elevating her legs and remaining active on a daily basis.  The patient reports that this has provided minimal improvement in her symptoms.  The patient still experiences pain along the varicosities located to her bilateral legs.  Patient also experiences edema which worsens towards the end of the day.  This edema is also associated with discomfort.  The patient feels that her symptoms have progressed to the point that she is unable to function on a daily basis and they have become lifestyle limiting.  The patient denies any claudication-like symptoms, rest pain or ulcer formation to the bilateral lower examinee.  The patient underwent a bilateral venous duplex which was notable for reflux in the right common femoral vein, right popliteal vein and left popliteal vein.  No reflux noted in the bilateral great saphenous and small saphenous veins.  No evidence of deep vein or sufficient time of phlebitis.  Patient denies any fever, nausea vomiting.  Denies any recent bouts of cellulitis.  Review of Systems  Constitutional: Negative.   HENT: Negative.   Eyes: Negative.   Respiratory: Negative.   Cardiovascular: Positive for leg swelling.       Painful varicose veins  Gastrointestinal: Negative.   Endocrine: Negative.   Genitourinary: Negative.   Musculoskeletal: Negative.   Skin: Negative.   Allergic/Immunologic: Negative.   Neurological: Negative.   Hematological: Negative.   Psychiatric/Behavioral: Negative.        Objective:   Physical Exam  Constitutional: She is oriented to person, place, and time. She appears well-developed and well-nourished. No distress.  HENT:  Head: Normocephalic and atraumatic.  Right Ear: External ear normal.  Left Ear: External ear normal.  Eyes: Pupils are equal, round, and reactive to light. Conjunctivae and EOM are normal.  Neck: Normal range of motion.  Cardiovascular: Normal rate, regular rhythm, normal heart sounds and intact distal pulses.  Pulses:      Radial pulses are 2+ on the right side, and 2+ on the left side.       Dorsalis pedis pulses are 2+ on the right side, and 2+ on the left side.       Posterior tibial pulses are 2+ on the right side, and 2+ on the left side.  Pulmonary/Chest: Effort normal and breath sounds normal.  Musculoskeletal: Normal range of motion. She exhibits edema (Mild 1+ pitting edema noted bilaterally).  Neurological: She is alert and oriented to person, place, and time.  Skin: Skin is warm and dry. She is not diaphoretic.  Scattered greater than 1 cm varicosities noted to the bilateral legs.  There is no stasis dermatitis, fibrosis, cellulitis or active ulcerations noted at this time.  Psychiatric: She has a normal mood and affect. Her behavior is normal. Judgment and thought content normal.  Vitals reviewed.  BP 99/69 (BP Location: Right Arm)   Pulse 73   Resp 16   Ht 5\' 4"  (1.626 m)   Wt 134 lb 6.4 oz (61 kg)   LMP 12/19/2016  BMI 23.07 kg/m   Past Medical History:  Diagnosis Date  . Allergic rhinitis   . Asthma   . Cervical polyp 04/07/2016  . Depression   . GERD (gastroesophageal reflux disease)   . History of chicken pox    Social History   Socioeconomic History  . Marital status: Married    Spouse name: Not on file  . Number of children: 2  . Years of education: Not on file  . Highest education level: Not on file  Occupational History  . Occupation: Government social research officer  Social Needs  . Financial  resource strain: Not on file  . Food insecurity:    Worry: Not on file    Inability: Not on file  . Transportation needs:    Medical: Not on file    Non-medical: Not on file  Tobacco Use  . Smoking status: Never Smoker  . Smokeless tobacco: Never Used  Substance and Sexual Activity  . Alcohol use: Yes    Comment: Occasional  . Drug use: No  . Sexual activity: Yes    Birth control/protection: Surgical    Comment: Partner had vasectomy   Lifestyle  . Physical activity:    Days per week: Not on file    Minutes per session: Not on file  . Stress: Not on file  Relationships  . Social connections:    Talks on phone: Not on file    Gets together: Not on file    Attends religious service: Not on file    Active member of club or organization: Not on file    Attends meetings of clubs or organizations: Not on file    Relationship status: Not on file  . Intimate partner violence:    Fear of current or ex partner: Not on file    Emotionally abused: Not on file    Physically abused: Not on file    Forced sexual activity: Not on file  Other Topics Concern  . Not on file  Social History Narrative   Lives in [place] with husband and 2 children. Works at [employer].      Regular Exercise -  Walk 2 to 3 times a week, 1.5 miles   Daily Caffeine Use:  Diet coke 2 to 3 16oz bottles                Past Surgical History:  Procedure Laterality Date  . CHOLECYSTECTOMY  08/2011   Dr Excell Seltzer  . NASAL SINUS SURGERY  2007   Family History  Problem Relation Age of Onset  . Hyperlipidemia Mother   . Rheum arthritis Father   . Arthritis Father   . Asthma Brother   . Hyperlipidemia Brother   . Depression Maternal Aunt        Bi-polar  . Cancer Maternal Uncle        Lung Ca - 70's  . Diabetes Paternal Aunt   . Diabetes Paternal Uncle   . Stroke Maternal Grandmother   . Cancer Maternal Grandfather        Colon & prostate - 70's  . Colon cancer Maternal Grandfather   .  Diabetes Paternal Grandmother    No Known Allergies     Assessment & Plan:  Patient presents to review vascular studies.  The patient was last seen approximately 3 months ago for a initial work-up of bilateral lower extremity edema painful varicose veins.  Since her initial visit, the patient has been engaging in conservative therapy including wearing medical grade  1 compression socks, elevating her legs and remaining active on a daily basis.  The patient reports that this has provided minimal improvement in her symptoms.  The patient still experiences pain along the varicosities located to her bilateral legs.  Patient also experiences edema which worsens towards the end of the day.  This edema is also associated with discomfort.  The patient feels that her symptoms have progressed to the point that she is unable to function on a daily basis and they have become lifestyle limiting.  The patient denies any claudication-like symptoms, rest pain or ulcer formation to the bilateral lower examinee.  The patient underwent a bilateral venous duplex which was notable for reflux in the right common femoral vein, right popliteal vein and left popliteal vein.  No reflux noted in the bilateral great saphenous and small saphenous veins.  No evidence of deep vein or sufficient time of phlebitis.  Patient denies any fever, nausea vomiting.  Denies any recent bouts of cellulitis.  1. Chronic venous insufficiency - New Patient was noted to have chronic venous insufficiency in the right common femoral and popliteal vein.  Left popliteal vein.  No reflux was noted in the bilateral great saphenous and small saphenous veins.  Because of the location of the patient's venous insufficiency she is not a candidate for endovenous laser ablation. The patient does continue to struggle with painful varicosities noted to the bilateral legs.  The patient has engaged in conservative therapy for approximately 3 months wearing medical grade  1 compression socks, elevating her legs and remaining active on a daily basis.  This is offered her no relief.  The discomfort that she experiences due to her varicosities have progressed to the point that she is unable to function on a daily basis and they have become lifestyle limiting. Recommend saline sclerotherapy to the varicosities noted in her bilateral legs I will apply to her insurance Patient to continue engaging conservative therapy at this time  2. Lymphedema - New Despite conservative treatments including exercise, elevation and class I compression stockings the patient still presents with stage I lymphedema The patient greatly benefit from the added therapy of a lymphedema pump I will applied to the patient's insurance The patient will continue to engage in conservative therapy  Patient to follow-up in 6 months. And will call for an appointment  Current Outpatient Medications on File Prior to Visit  Medication Sig Dispense Refill  . acetaminophen (TYLENOL) 500 MG tablet Take 500 mg by mouth every 6 (six) hours as needed.    Marland Kitchen albuterol (PROAIR HFA) 108 (90 Base) MCG/ACT inhaler Inhale 2 puffs into the lungs every 6 (six) hours as needed. 1 Inhaler 3  . predniSONE (DELTASONE) 20 MG tablet Take 2 tablets (40 mg total) by mouth daily with breakfast. (Patient not taking: Reported on 08/29/2018) 10 tablet 0   No current facility-administered medications on file prior to visit.    There are no Patient Instructions on file for this visit. No follow-ups on file.  Modesta Sammons A Dominque Levandowski, PA-C

## 2018-09-15 IMAGING — MG MM DIGITAL SCREENING BILAT W/ TOMO W/ CAD
8 of 12 series · 8 of 28 positions shown · non-contrast
Comparison: Previous exam(s).

CLINICAL DATA: Screening.

EXAM:
2D DIGITAL SCREENING BILATERAL MAMMOGRAM WITH CAD AND ADJUNCT TOMO

[L CC]
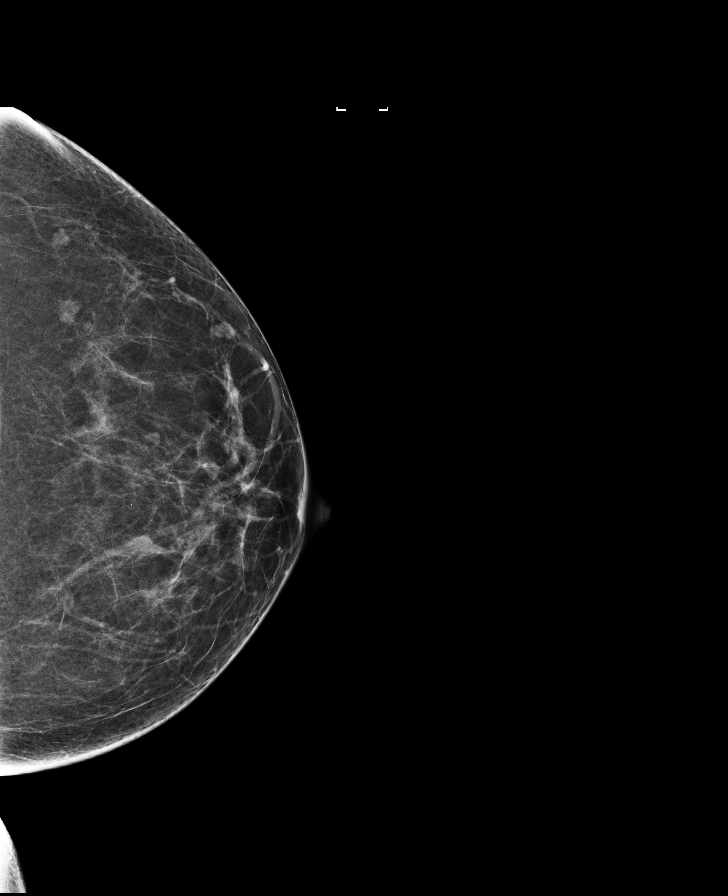

[L CC synth-2D]
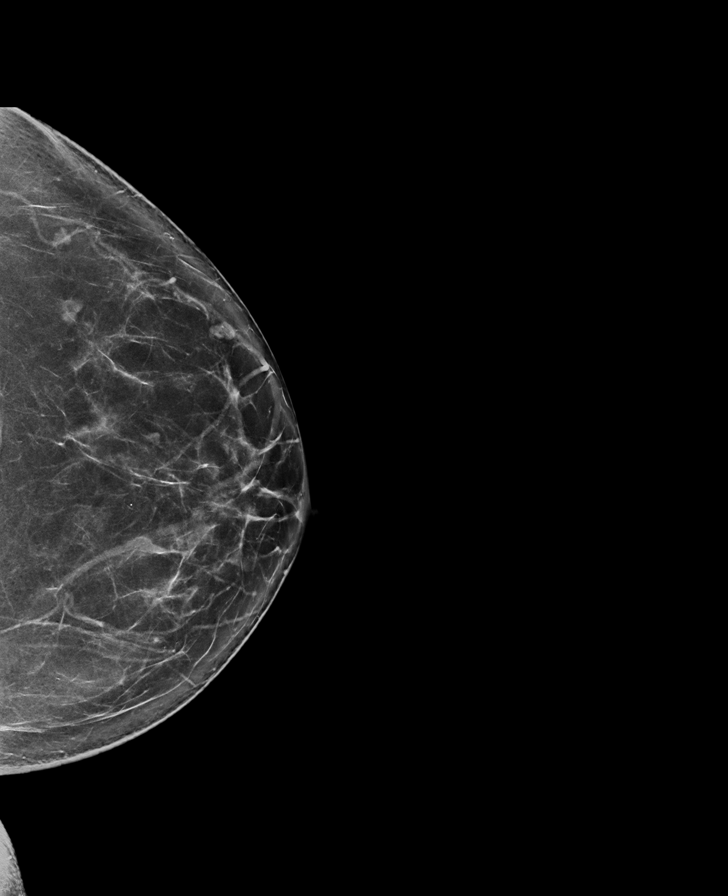

[R CC synth-2D]
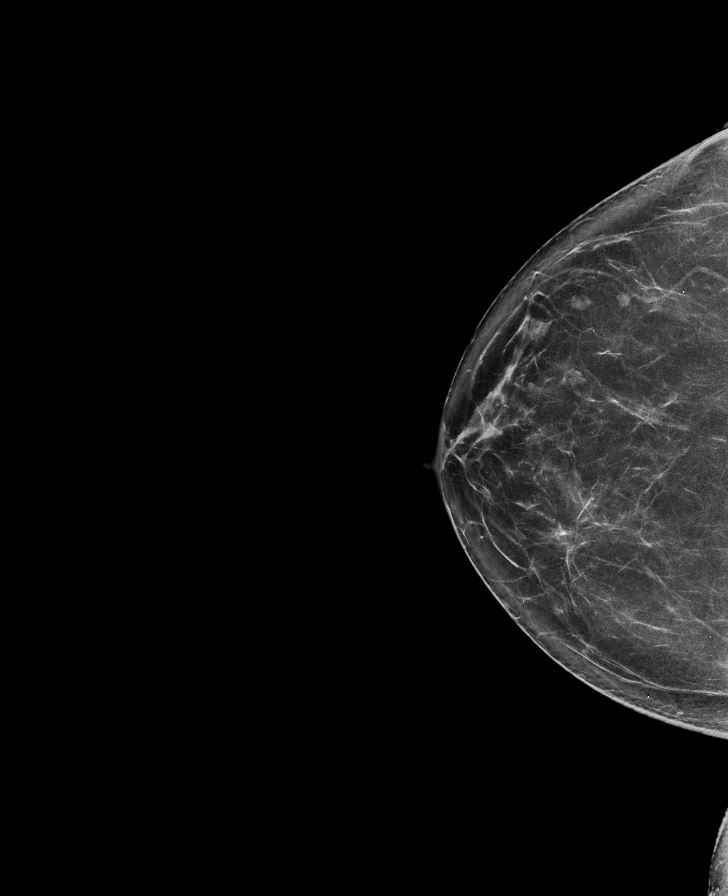

[R MLO]
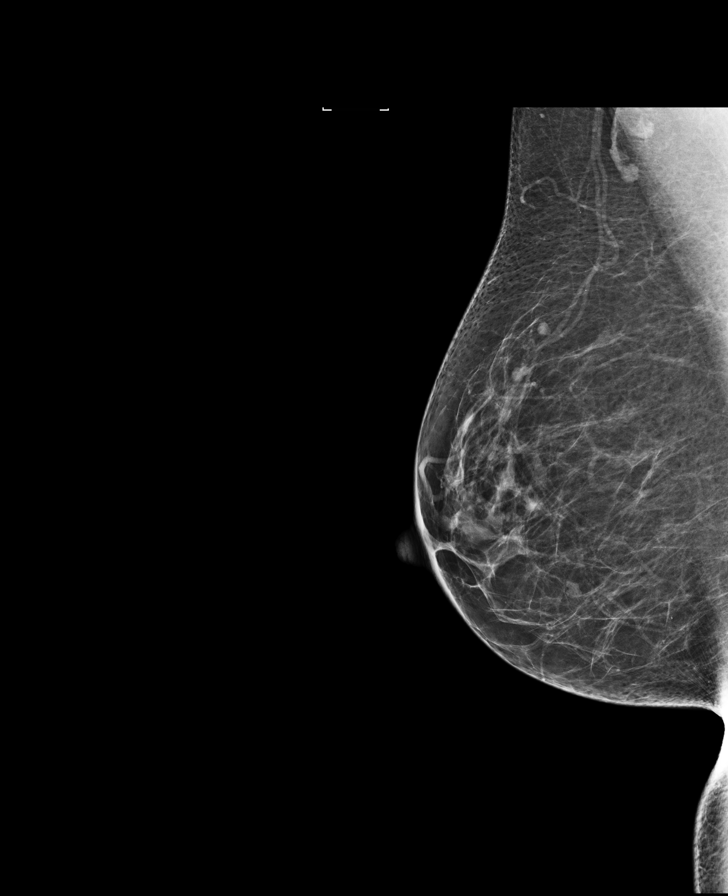

[R MLO synth-2D]
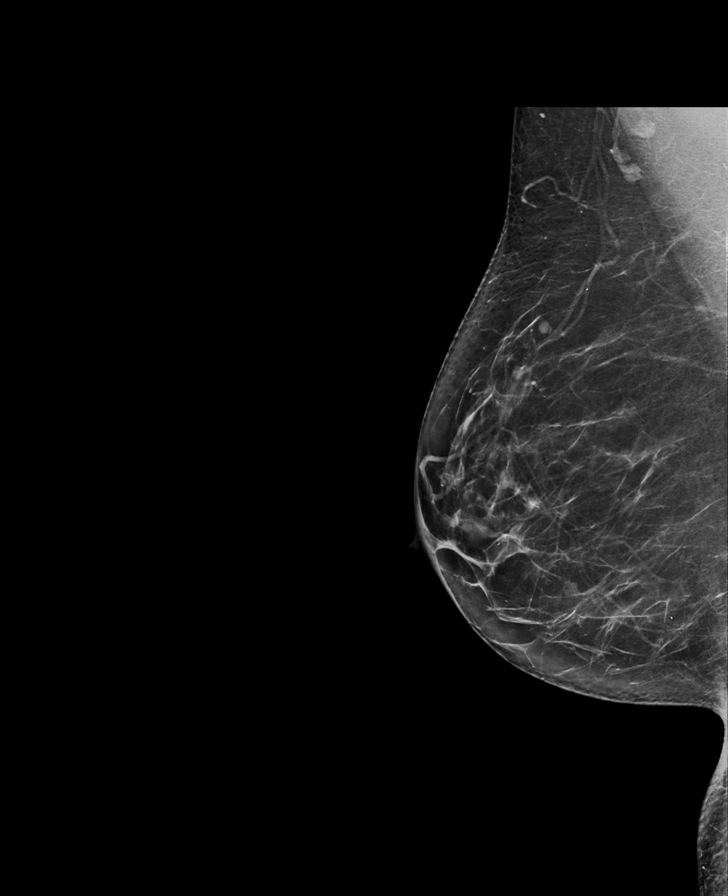

[L MLO synth-2D]
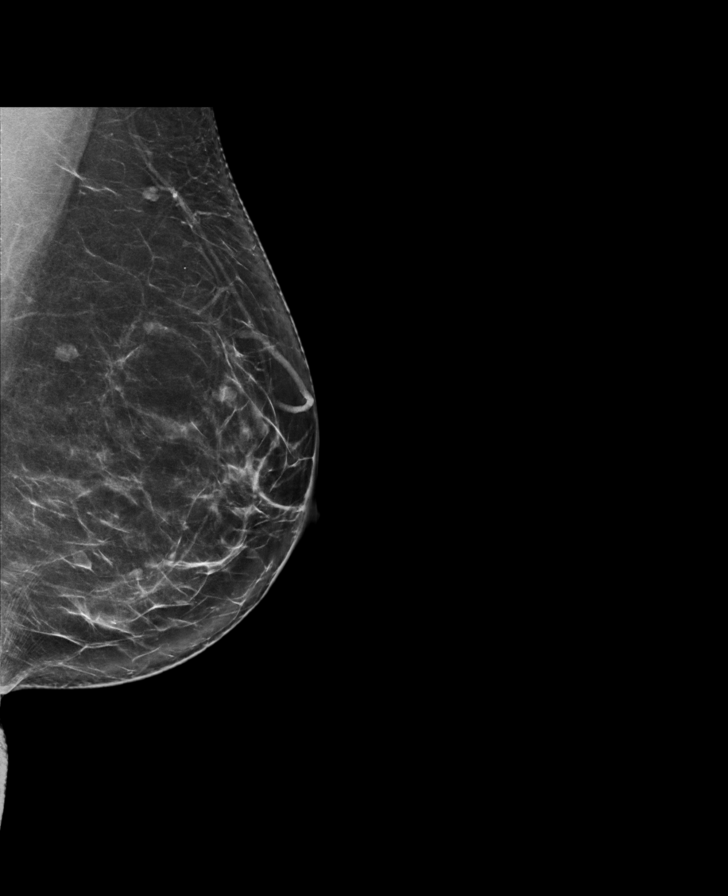

[R CC]
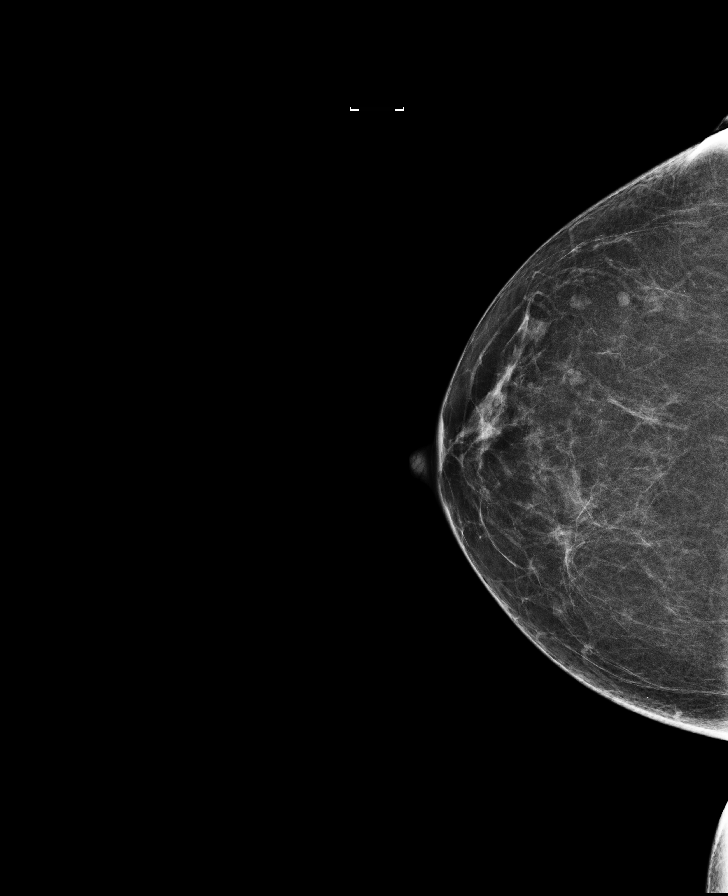

[L MLO]
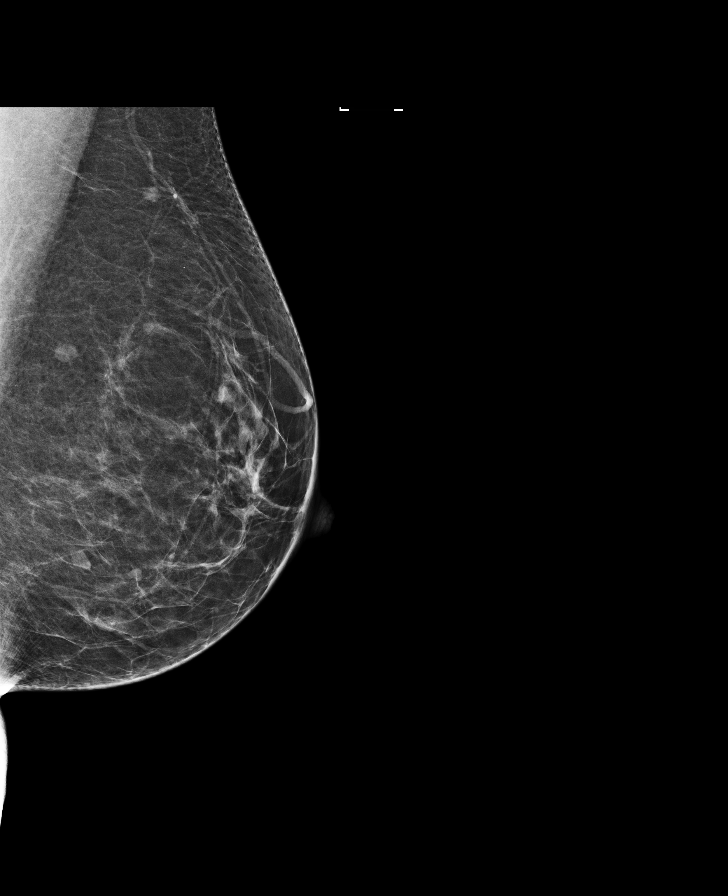

[8 of 28 positions shown; findings below may reference images not displayed]

ACR Breast Density Category b: There are scattered areas of
fibroglandular density.
FINDINGS: There are no findings suspicious for malignancy. Images were
processed with CAD.
IMPRESSION: No mammographic evidence of malignancy. A result letter of this
screening mammogram will be mailed directly to the patient.

RECOMMENDATION:
Screening mammogram in one year. (Code:97-6-RS4)

BI-RADS CATEGORY  1: Negative.

## 2018-12-06 ENCOUNTER — Encounter

## 2018-12-06 ENCOUNTER — Encounter (INDEPENDENT_AMBULATORY_CARE_PROVIDER_SITE_OTHER): Payer: BC Managed Care – PPO

## 2018-12-06 ENCOUNTER — Ambulatory Visit (INDEPENDENT_AMBULATORY_CARE_PROVIDER_SITE_OTHER): Payer: BC Managed Care – PPO | Admitting: Vascular Surgery

## 2018-12-11 ENCOUNTER — Other Ambulatory Visit: Payer: Self-pay | Admitting: Family Medicine

## 2018-12-11 DIAGNOSIS — J45909 Unspecified asthma, uncomplicated: Secondary | ICD-10-CM

## 2019-01-10 ENCOUNTER — Ambulatory Visit: Payer: Self-pay

## 2019-01-10 NOTE — Telephone Encounter (Signed)
Patient called in with c/o "eye dilation." She says "my left eye has been dilating off and on for a few months and lasting a short period of time, less than 30 minutes. Today, I woke up at 5 am and my eye was dilated and it is still dilated now. I didn't know if I needed to come in to the office or call my eye doctor." I asked about other symptoms, eye pain, vision loss, blurred vision, headache, dizziness, numbness to face, tingling to face at any time during the dilation or after, speech problems;  she denies all symptoms. Appointment scheduled for tomorrow at 1515 with Dr. Birdie Sons. Advised if she starts experiencing any of the above mentioned symptoms to go to the ED immediately, patient verbalized understanding.  Answer Assessment - Initial Assessment Questions 1. SYMPTOM: "What is the main symptom you are concerned about?" (e.g., weakness, numbness)     Left eye dilated 2. ONSET: "When did this start?" (minutes, hours, days; while sleeping)     When got up around 5 am 3. LAST NORMAL: "When was the last time you were normal (no symptoms)?"     When went to bed last night at 10 pm 4. PATTERN "Does this come and go, or has it been constant since it started?"  "Is it present now?"     Comes and goes and present now 5. CARDIAC SYMPTOMS: "Have you had any of the following symptoms: chest pain, difficulty breathing, palpitations?"     No 6. NEUROLOGIC SYMPTOMS: "Have you had any of the following symptoms: headache, dizziness, vision loss, double vision, changes in speech, unsteady on your feet?"     No 7. OTHER SYMPTOMS: "Do you have any other symptoms?"     No 8. PREGNANCY: "Is there any chance you are pregnant?" "When was your last menstrual period?"     No  Protocols used: NEUROLOGIC DEFICIT-A-AH

## 2019-01-11 ENCOUNTER — Ambulatory Visit (INDEPENDENT_AMBULATORY_CARE_PROVIDER_SITE_OTHER): Payer: BC Managed Care – PPO | Admitting: Family Medicine

## 2019-01-11 ENCOUNTER — Encounter: Payer: Self-pay | Admitting: Family Medicine

## 2019-01-11 VITALS — BP 98/64 | HR 75 | Temp 97.5°F | Resp 17 | Ht 64.0 in | Wt 143.2 lb

## 2019-01-11 DIAGNOSIS — H5702 Anisocoria: Secondary | ICD-10-CM | POA: Diagnosis not present

## 2019-01-11 NOTE — Progress Notes (Signed)
Marikay Alar, MD Phone: [phone]  Tanya Kramer is a 53 y.o. female who presents today for same-day visit.  CC: Eye issue-pupil dilation  Patient reports a long history of left pupil dilation that would typically occur when she was ill when she was younger.  She notes more recently over the last 3 months her left pupil would dilate on its own without feeling ill.  It would last for about an hour and then resolve on its own.  She notes yesterday her left pupil dilated for about 5 hours and then returned to her baseline.  She does not feel ill.  She has no headaches.  Minimal sinus pressure.  She notes no vision changes, numbness, weakness, dizziness, lightheadedness, eye pain, or any other symptoms.  She feels as though she is at her baseline.  Social History   Tobacco Use  Smoking Status Never Smoker  Smokeless Tobacco Never Used     ROS see history of present illness  Objective  Physical Exam Vitals:   01/11/19 1518  BP: 98/64  Pulse: 75  Resp: 17  Temp: (!) 97.5 F (36.4 C)  SpO2: 98%    BP Readings from Last 3 Encounters:  01/11/19 98/64  08/29/18 99/69  06/25/18 98/64   Wt Readings from Last 3 Encounters:  01/11/19 143 lb 4 oz (65 kg)  08/29/18 134 lb 6.4 oz (61 kg)  06/25/18 133 lb 6.4 oz (60.5 kg)    Physical Exam Constitutional:      General: She is not in acute distress.    Appearance: She is not diaphoretic.  Eyes:     Extraocular Movements: Extraocular movements intact.     Conjunctiva/sclera: Conjunctivae normal.     Comments: Left pupil mildly larger than the right pupil on exam in light and in dark, the differences in pupil size do not change between light and dark, both pupils are somewhat slowly reactive to light, fundus exam attempted though pupillary constriction made this difficult  Cardiovascular:     Rate and Rhythm: Normal rate and regular rhythm.     Heart sounds: Normal heart sounds.  Pulmonary:     Effort: Pulmonary effort is  normal.     Breath sounds: Normal breath sounds.  Skin:    General: Skin is warm and dry.  Neurological:     Mental Status: She is alert.     Comments: CN 2-12 intact, 5/5 strength in bilateral biceps, triceps, grip, quads, hamstrings, plantar and dorsiflexion, sensation to light touch intact in bilateral UE and LE, normal gait, absent patellar reflexes      Assessment/Plan: Please see individual problem list.  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  Potentially could be physiologic anisicoria versus an underlying cause.  This has been a chronic intermittent issue.  I would suspect if there was a significant underlying cause that her symptoms would not improve on their own and would be persistent.  We will refer to ophthalmology for further evaluation.  I have discussed with our referral coordinator try to get her an appointment in the next 1 to 2 weeks.  She was advised if she develops any additional symptoms with this she should be evaluated right away.  Given return precautions.    Orders Placed This Encounter  Procedures  . Ambulatory referral to Ophthalmology    Referral Priority:   Routine    Referral Type:   Consultation    Referral Reason:   Specialty Services Required    Requested  Specialty:   Ophthalmology    Number of Visits Requested:   1    No orders of the defined types were placed in this encounter.    Marikay AlarEric Sonnenberg, MD [facility] [place]

## 2019-01-11 NOTE — Assessment & Plan Note (Addendum)
Undetermined cause.  She is neurologically intact.  Potentially could be physiologic anisicoria versus an underlying cause.  This has been a chronic intermittent issue.  I would suspect if there was a significant underlying cause that her symptoms would not improve on their own and would be persistent.  We will refer to ophthalmology for further evaluation.  I have discussed with our referral coordinator try to get her an appointment in the next 1 to 2 weeks.  She was advised if she develops any additional symptoms with this she should be evaluated right away.  Given return precautions.

## 2019-01-11 NOTE — Patient Instructions (Signed)
Nice to see you. We will get you to see ophthalmology. If you develop any additional symptoms or persistent pupil dilation please be reevaluated.

## 2019-01-16 DIAGNOSIS — H5702 Anisocoria: Secondary | ICD-10-CM | POA: Diagnosis not present

## 2019-02-04 ENCOUNTER — Telehealth (INDEPENDENT_AMBULATORY_CARE_PROVIDER_SITE_OTHER): Payer: Self-pay

## 2019-02-04 NOTE — Telephone Encounter (Signed)
Per the provider's recommendation a lymph pump was requested.  HI Selena Batten and Vernona Rieger, [facility] all is well. I wanted to let you know that  patient Tanya Kramer has informed us she does not want a pump. Thank you

## 2019-06-10 ENCOUNTER — Other Ambulatory Visit: Payer: Self-pay

## 2019-06-12 ENCOUNTER — Other Ambulatory Visit: Payer: Self-pay

## 2019-06-12 ENCOUNTER — Encounter: Payer: Self-pay | Admitting: Family Medicine

## 2019-06-12 ENCOUNTER — Telehealth: Payer: Self-pay | Admitting: Family Medicine

## 2019-06-12 ENCOUNTER — Ambulatory Visit (INDEPENDENT_AMBULATORY_CARE_PROVIDER_SITE_OTHER): Payer: BC Managed Care – PPO | Admitting: Family Medicine

## 2019-06-12 DIAGNOSIS — E781 Pure hyperglyceridemia: Secondary | ICD-10-CM

## 2019-06-12 DIAGNOSIS — J45909 Unspecified asthma, uncomplicated: Secondary | ICD-10-CM

## 2019-06-12 DIAGNOSIS — J0101 Acute recurrent maxillary sinusitis: Secondary | ICD-10-CM

## 2019-06-12 DIAGNOSIS — E559 Vitamin D deficiency, unspecified: Secondary | ICD-10-CM

## 2019-06-12 DIAGNOSIS — Z0001 Encounter for general adult medical examination with abnormal findings: Secondary | ICD-10-CM | POA: Diagnosis not present

## 2019-06-12 DIAGNOSIS — Z1329 Encounter for screening for other suspected endocrine disorder: Secondary | ICD-10-CM

## 2019-06-12 MED ORDER — ALBUTEROL SULFATE HFA 108 (90 BASE) MCG/ACT IN AERS: INHALATION_SPRAY | RESPIRATORY_TRACT | 1 refills | Status: DC

## 2019-06-12 MED ORDER — AMOXICILLIN-POT CLAVULANATE 875-125 MG PO TABS: 1.0000 | ORAL_TABLET | Freq: Two times a day (BID) | ORAL | 0 refills | Status: DC

## 2019-06-12 NOTE — Assessment & Plan Note (Addendum)
Physical exam completed today.  Given her symptoms of cough and our current protocol for respiratory symptoms I discussed that we would need her to come in at a future date for her lab work and consideration of pelvic exam and breast exam.  Encouraged continued exercise and diet.  We will get her referred to GI to consider colonoscopy.  Discussed Shingrix though she opted to wait on this and check with her insurance.  She declined HIV screening.

## 2019-06-12 NOTE — Patient Instructions (Signed)
Nice to see you. You likely have a sinus infection. Please take the augmentin.  If you develop worsening symptoms please contact us right away. If you develop shortness of breath please go to the ED.

## 2019-06-12 NOTE — Assessment & Plan Note (Signed)
Patient symptoms are likely due to sinusitis given the duration of symptoms.  They additionally could be related to allergic rhinitis.  I do not believe these are related to COVID-19 given the duration of her symptoms and lack of worsening over this timeframe.  We will treat with Augmentin.  If not improving she will contact us.

## 2019-06-12 NOTE — Telephone Encounter (Signed)
Lab order will be needed for pt appt 09/12/2019. Please and Thank you!

## 2019-06-12 NOTE — Progress Notes (Signed)
Marikay AlarEric Roena Sassaman, MD Phone: [phone]  Paul DykesLisa B Stclair is a 53 y.o. female who presents today for CPE.  Exercises by walking some twice a week. Diet consists of proteins, cheese, and vegetables.  Not much starches. Pap smear completed 04/07/2016 was negative for cells and HPV.  She has not had a menstrual cycle in 4 to 5 years.  She is sexually active with her husband. Mammogram is due in July. She reports she did not undergo colonoscopy as she was afraid of having to sit in the bathroom all day given her issues with her stools after her cholecystectomy. No family history of breast cancer or ovarian cancer.  Her maternal grandfather had colon cancer. Tetanus vaccine up-to-date.  She notes she will wait on the Shingrix vaccine. She declines HIV screening. No tobacco use or illicit drug use.  She will have 1 alcoholic beverage every few weeks. She sees a Education officer, communitydentist and an ophthalmologist.  Cough: Patient notes she has had this since March.  She typically has symptoms this time of year and notes they are usually due to allergies or a sinus infection.  She notes she is not able to blow anything out of her nose though reports sinus congestion.  No shortness of breath.  Some sneezing though not much.  She does produce clear phlegm.  She cannot take second-generation antihistamines because they cause pressure buildup in her ears so she notes she just deals with it until she eventually requires an antibiotic.  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  She has been staying home and rarely goes out given her history of asthma.  She has no known bleeding to closure.  No fevers.  Active Ambulatory Problems    Diagnosis Date Noted  . Asthma 03/15/2012  . GERD (gastroesophageal reflux disease) 03/15/2012  . Gastroparesis 09/18/2012  . Encounter for general adult medical examination with abnormal findings 07/21/2014  . Hypertriglyceridemia 07/21/2014  . Allergic rhinitis  12/05/2016  . Sinusitis 07/26/2017  . Asymptomatic varicose veins of left lower extremity 06/06/2018  . Lower extremity pain, bilateral 06/25/2018  . Asthma exacerbation 06/25/2018  . Chronic venous insufficiency 08/29/2018  . Lymphedema 08/29/2018  . Anisocoria 01/11/2019   Resolved Ambulatory Problems    Diagnosis Date Noted  . Sinusitis 03/15/2012  . General medical exam 03/15/2012  . Asthma with acute exacerbation 03/18/2013  . Urinary urgency 04/07/2016  . Cervical polyp 04/07/2016   Past Medical History:  Diagnosis Date  . Allergic rhinitis   . Asthma   . Depression   . History of chicken pox     Family History  Problem Relation Age of Onset  . Hyperlipidemia Mother   . Rheum arthritis Father   . Arthritis Father   . Asthma Brother   . Hyperlipidemia Brother   . Depression Maternal Aunt        Bi-polar  . Cancer Maternal Uncle        Lung Ca - 70's  . Diabetes Paternal Aunt   . Diabetes Paternal Uncle   . Stroke Maternal Grandmother   . Cancer Maternal Grandfather        Colon & prostate - 70's  . Colon cancer Maternal Grandfather   . Diabetes Paternal Grandmother     Social History   Socioeconomic History  . Marital status: Married    Spouse name: Not on file  . Number of children: 2  . Years of education: Not on file  .  Highest education level: Not on file  Occupational History  . Occupation: Surveyor, minerals  Social Needs  . Financial resource strain: Not on file  . Food insecurity    Worry: Not on file    Inability: Not on file  . Transportation needs    Medical: Not on file    Non-medical: Not on file  Tobacco Use  . Smoking status: Never Smoker  . Smokeless tobacco: Never Used  Substance and Sexual Activity  . Alcohol use: Yes    Comment: Occasional  . Drug use: No  . Sexual activity: Yes    Birth control/protection: Surgical    Comment: Partner had vasectomy   Lifestyle  . Physical activity    Days per week: Not on file     Minutes per session: Not on file  . Stress: Not on file  Relationships  . Social Herbalist on phone: Not on file    Gets together: Not on file    Attends religious service: Not on file    Active member of club or organization: Not on file    Attends meetings of clubs or organizations: Not on file    Relationship status: Not on file  . Intimate partner violence    Fear of current or ex partner: Not on file    Emotionally abused: Not on file    Physically abused: Not on file    Forced sexual activity: Not on file  Other Topics Concern  . Not on file  Social History Narrative   Lives in [place] with husband and 2 children. Works at [employer].      Regular Exercise -  Walk 2 to 3 times a week, 1.5 miles   Daily Caffeine Use:  Diet coke 2 to 3 16oz bottles                 ROS  General:  Negative for nexplained weight loss, fever Skin: Negative for new or changing mole, sore that won't heal HEENT: Negative for trouble hearing, trouble seeing, ringing in ears, mouth sores, hoarseness, change in voice, dysphagia. CV:  Negative for chest pain, dyspnea, edema, palpitations Resp: Positive for cough, sinus pressure, negative for dyspnea, hemoptysis GI: Negative for nausea, vomiting, diarrhea, constipation, abdominal pain, melena, hematochezia. GU: Negative for dysuria, incontinence, urinary hesitance, hematuria, vaginal or penile discharge, polyuria, sexual difficulty, lumps in testicle or breasts MSK: Negative for muscle cramps or aches, joint pain or swelling Neuro: Negative for headaches, weakness, numbness, dizziness, passing out/fainting Psych: Negative for depression, anxiety, memory problems  Objective  Physical Exam Vitals:   06/12/19 0837  BP: 120/70  Pulse: 77  Temp: 98.3 F (36.8 C)  SpO2: 97%    BP Readings from Last 3 Encounters:  06/12/19 120/70  01/11/19 98/64  08/29/18 99/69   Wt Readings from Last 3 Encounters:  06/12/19 144 lb  (65.3 kg)  01/11/19 143 lb 4 oz (65 kg)  08/29/18 134 lb 6.4 oz (61 kg)    Physical Exam Constitutional:      General: She is not in acute distress.    Appearance: She is not diaphoretic.  HENT:     Head: Normocephalic and atraumatic.     Right Ear: Tympanic membrane normal.     Left Ear: Tympanic membrane normal.     Mouth/Throat:     Mouth: Mucous membranes are moist.     Pharynx: Oropharynx is clear.  Eyes:     Conjunctiva/sclera: Conjunctivae normal.  Pupils: Pupils are equal, round, and reactive to light.  Cardiovascular:     Rate and Rhythm: Normal rate and regular rhythm.     Heart sounds: Normal heart sounds.  Pulmonary:     Effort: Pulmonary effort is normal.     Breath sounds: Normal breath sounds.  Abdominal:     General: Bowel sounds are normal. There is no distension.     Palpations: Abdomen is soft. There is no mass.     Tenderness: There is no abdominal tenderness. There is no guarding or rebound.  Musculoskeletal:     Right lower leg: No edema.     Left lower leg: No edema.  Lymphadenopathy:     Cervical: No cervical adenopathy.  Skin:    General: Skin is warm and dry.  Neurological:     Mental Status: She is alert.  Psychiatric:        Mood and Affect: Mood normal.      Assessment/Plan:   Encounter for general adult medical examination with abnormal findings Physical exam completed today.  Given her symptoms of cough and our current protocol for respiratory symptoms I discussed that we would need her to come in at a future date for her lab work and consideration of pelvic exam and breast exam.  Encouraged continued exercise and diet.  We will get her referred to GI to consider colonoscopy.  Discussed Shingrix though she opted to wait on this and check with her insurance.  She declined HIV screening.  Sinusitis Patient symptoms are likely due to sinusitis given the duration of symptoms.  They additionally could be related to allergic rhinitis.  I  do not believe these are related to COVID-19 given the duration of her symptoms and lack of worsening over this timeframe.  We will treat with Augmentin.  If not improving she will contact us.   No orders of the defined types were placed in this encounter.   Meds ordered this encounter  Medications  . amoxicillin-clavulanate (AUGMENTIN) 875-125 MG tablet    Sig: Take 1 tablet by mouth 2 (two) times daily.    Dispense:  14 tablet    Refill:  0  . albuterol (PROAIR HFA) 108 (90 Base) MCG/ACT inhaler    Sig: TAKE 2 PUFFS BY MOUTH EVERY 6 HOURS AS NEEDED    Dispense:  8.5 g    Refill:  1     Marikay AlarEric Annika Selke, MD [facility] [place]

## 2019-06-14 DIAGNOSIS — E559 Vitamin D deficiency, unspecified: Secondary | ICD-10-CM | POA: Insufficient documentation

## 2019-06-14 NOTE — Telephone Encounter (Signed)
Orders have been placed.  Please get her scheduled for labs 1 week prior to her appointment.

## 2019-06-14 NOTE — Telephone Encounter (Signed)
Done. Nina,cma  

## 2019-06-14 NOTE — Telephone Encounter (Signed)
Patient wants lab work before her appointment in September please place orders.  Wen Munford,cma

## 2019-09-12 ENCOUNTER — Other Ambulatory Visit: Payer: BC Managed Care – PPO

## 2019-09-16 ENCOUNTER — Ambulatory Visit: Payer: BC Managed Care – PPO | Admitting: Family Medicine

## 2019-10-01 ENCOUNTER — Other Ambulatory Visit: Payer: Self-pay | Admitting: Family Medicine

## 2019-10-01 DIAGNOSIS — Z1231 Encounter for screening mammogram for malignant neoplasm of breast: Secondary | ICD-10-CM

## 2020-01-22 ENCOUNTER — Ambulatory Visit
Admission: RE | Admit: 2020-01-22 | Discharge: 2020-01-22 | Disposition: A | Discharge status: Home / Self Care | Payer: BC Managed Care – PPO | Source: Ambulatory Visit | Attending: Family Medicine | Admitting: Family Medicine

## 2020-01-22 DIAGNOSIS — Z1231 Encounter for screening mammogram for malignant neoplasm of breast: Secondary | ICD-10-CM | POA: Insufficient documentation

## 2021-04-06 ENCOUNTER — Other Ambulatory Visit: Payer: Self-pay | Admitting: Family Medicine

## 2021-04-06 DIAGNOSIS — Z1231 Encounter for screening mammogram for malignant neoplasm of breast: Secondary | ICD-10-CM

## 2021-04-15 ENCOUNTER — Other Ambulatory Visit: Payer: Self-pay

## 2021-04-15 ENCOUNTER — Ambulatory Visit
Admission: RE | Admit: 2021-04-15 | Discharge: 2021-04-15 | Disposition: A | Discharge status: Home / Self Care | Payer: BC Managed Care – PPO | Source: Ambulatory Visit | Attending: Family Medicine | Admitting: Family Medicine

## 2021-04-15 DIAGNOSIS — Z1231 Encounter for screening mammogram for malignant neoplasm of breast: Secondary | ICD-10-CM | POA: Diagnosis present

## 2021-04-19 ENCOUNTER — Other Ambulatory Visit: Payer: Self-pay | Admitting: Family Medicine

## 2021-04-19 DIAGNOSIS — N6489 Other specified disorders of breast: Secondary | ICD-10-CM

## 2021-04-19 DIAGNOSIS — R928 Other abnormal and inconclusive findings on diagnostic imaging of breast: Secondary | ICD-10-CM

## 2021-04-21 ENCOUNTER — Ambulatory Visit
Admission: RE | Admit: 2021-04-21 | Discharge: 2021-04-21 | Disposition: A | Discharge status: Home / Self Care | Payer: BC Managed Care – PPO | Source: Ambulatory Visit | Attending: Family Medicine | Admitting: Family Medicine

## 2021-04-21 ENCOUNTER — Other Ambulatory Visit: Payer: Self-pay

## 2021-04-21 DIAGNOSIS — N6489 Other specified disorders of breast: Secondary | ICD-10-CM

## 2021-04-21 DIAGNOSIS — R928 Other abnormal and inconclusive findings on diagnostic imaging of breast: Secondary | ICD-10-CM

## 2021-10-07 ENCOUNTER — Telehealth: Payer: Self-pay | Admitting: Family Medicine

## 2021-10-07 ENCOUNTER — Other Ambulatory Visit: Payer: Self-pay | Admitting: Family Medicine

## 2021-10-07 DIAGNOSIS — N6001 Solitary cyst of right breast: Secondary | ICD-10-CM

## 2021-10-07 DIAGNOSIS — Z1211 Encounter for screening for malignant neoplasm of colon: Secondary | ICD-10-CM

## 2021-10-07 NOTE — Telephone Encounter (Signed)
Patient  is calling in to check on the status of her mammogram order to be signed for Dr.Sonnenberg.She would also like to have Dr.Sonnenberg sign off for her to have a colonoscopy done.  Tanya Kramer,cma

## 2021-10-07 NOTE — Telephone Encounter (Signed)
Patient called in to schedule her physical with Dr.Sonnenberg and on the call she is calling in to check on the status of her mammogram order to be signed for Dr.Sonnenberg.She would also like to have Dr.Sonnenberg sign off for her to have a colonoscopy done.Please call her at [phone].

## 2021-10-08 NOTE — Telephone Encounter (Signed)
I called and spoke with the patient and informed her that the provider ordered the colonoscopy and they will contact her to schedule and she understood.  Madisan Bice,cma

## 2021-10-08 NOTE — Telephone Encounter (Signed)
It looks like her mammogram is already scheduled.  I placed the order for the colonoscopy.

## 2021-10-25 ENCOUNTER — Ambulatory Visit
Admission: RE | Admit: 2021-10-25 | Discharge: 2021-10-25 | Disposition: A | Discharge status: Home / Self Care | Payer: BC Managed Care – PPO | Source: Ambulatory Visit | Attending: Family Medicine | Admitting: Family Medicine

## 2021-10-25 ENCOUNTER — Other Ambulatory Visit: Payer: Self-pay

## 2021-10-25 DIAGNOSIS — N6001 Solitary cyst of right breast: Secondary | ICD-10-CM

## 2021-11-23 ENCOUNTER — Encounter: Payer: Self-pay | Admitting: Family Medicine

## 2021-11-23 ENCOUNTER — Other Ambulatory Visit (HOSPITAL_COMMUNITY)
Admission: RE | Admit: 2021-11-23 | Discharge: 2021-11-23 | Disposition: A | Discharge status: Home / Self Care | Payer: BC Managed Care – PPO | Source: Ambulatory Visit | Attending: Family Medicine | Admitting: Family Medicine

## 2021-11-23 ENCOUNTER — Other Ambulatory Visit: Payer: Self-pay

## 2021-11-23 ENCOUNTER — Ambulatory Visit (INDEPENDENT_AMBULATORY_CARE_PROVIDER_SITE_OTHER): Payer: BC Managed Care – PPO | Admitting: Family Medicine

## 2021-11-23 ENCOUNTER — Telehealth: Payer: Self-pay | Admitting: Family Medicine

## 2021-11-23 VITALS — BP 110/80 | HR 80 | Temp 97.9°F | Ht 64.0 in | Wt 154.2 lb

## 2021-11-23 DIAGNOSIS — E559 Vitamin D deficiency, unspecified: Secondary | ICD-10-CM | POA: Diagnosis not present

## 2021-11-23 DIAGNOSIS — Z114 Encounter for screening for human immunodeficiency virus [HIV]: Secondary | ICD-10-CM

## 2021-11-23 DIAGNOSIS — Z23 Encounter for immunization: Secondary | ICD-10-CM

## 2021-11-23 DIAGNOSIS — E663 Overweight: Secondary | ICD-10-CM

## 2021-11-23 DIAGNOSIS — Z124 Encounter for screening for malignant neoplasm of cervix: Secondary | ICD-10-CM

## 2021-11-23 DIAGNOSIS — E781 Pure hyperglyceridemia: Secondary | ICD-10-CM | POA: Diagnosis not present

## 2021-11-23 DIAGNOSIS — Z1159 Encounter for screening for other viral diseases: Secondary | ICD-10-CM

## 2021-11-23 DIAGNOSIS — Z0000 Encounter for general adult medical examination without abnormal findings: Secondary | ICD-10-CM | POA: Diagnosis not present

## 2021-11-23 LAB — COMPREHENSIVE METABOLIC PANEL
ALT: 19 U/L (ref 0–35)
AST: 16 U/L (ref 0–37)
Albumin: 4.6 g/dL (ref 3.5–5.2)
Alkaline Phosphatase: 80 U/L (ref 39–117)
BUN: 10 mg/dL (ref 6–23)
CO2: 27 mEq/L (ref 19–32)
Calcium: 9.3 mg/dL (ref 8.4–10.5)
Chloride: 102 mEq/L (ref 96–112)
Creatinine, Ser: 0.65 mg/dL (ref 0.40–1.20)
GFR: 100.49 mL/min (ref >60.00)
Glucose, Bld: 88 mg/dL (ref 70–99)
Potassium: 4 mEq/L (ref 3.5–5.1)
Sodium: 137 mEq/L (ref 135–145)
Total Bilirubin: 0.6 mg/dL (ref 0.2–1.2)
Total Protein: 7.2 g/dL (ref 6.0–8.3)

## 2021-11-23 LAB — LIPID PANEL
Cholesterol: 203 mg/dL — ABNORMAL HIGH (ref 0–200)
HDL: 58 mg/dL (ref >39.00)
NonHDL: 144.64
Total CHOL/HDL Ratio: 3
Triglycerides: 238 mg/dL — ABNORMAL HIGH (ref 0.0–149.0)
VLDL: 47.6 mg/dL — ABNORMAL HIGH (ref 0.0–40.0)

## 2021-11-23 LAB — HEMOGLOBIN A1C: Hgb A1c MFr Bld: 5.5 % (ref 4.6–6.5)

## 2021-11-23 LAB — LDL CHOLESTEROL, DIRECT: Direct LDL: 114 mg/dL

## 2021-11-23 LAB — VITAMIN D 25 HYDROXY (VIT D DEFICIENCY, FRACTURES): VITD: 17.42 ng/mL — ABNORMAL LOW (ref 30.00–100.00)

## 2021-11-23 NOTE — Progress Notes (Signed)
Tommi Rumps, MD Phone: [phone]  Tanya Kramer is a 55 y.o. female who presents today for CPE.  Diet: stress eating now, she will get back to her lean keto diet after the holidays Exercise: walking 20-30 minutes 3-4x/week Pap smear: due Colonoscopy: scheduled to meet with GI in January Mammogram: 10/25/21 - 6 month recall Family history-  Colon cancer: paternal grandfather  Breast cancer: no  Ovarian cancer: no Menses: postmenopausal Vaccines-   Flu: defers to next week  Tetanus: UTD  Shingles: due  COVID19: x2, declines further boosters HIV screening: due Hep C Screening: due Tobacco use: no Alcohol use: rare Illicit Drug use: no Dentist: yes Ophthalmology: yes   Active Ambulatory Problems    Diagnosis Date Noted   Asthma 03/15/2012   GERD (gastroesophageal reflux disease) 03/15/2012   Gastroparesis 09/18/2012   Routine general medical examination at a health care facility 07/21/2014   Hypertriglyceridemia 07/21/2014   Allergic rhinitis 12/05/2016   Asymptomatic varicose veins of left lower extremity 06/06/2018   Lower extremity pain, bilateral 06/25/2018   Asthma exacerbation 06/25/2018   Chronic venous insufficiency 08/29/2018   Lymphedema 08/29/2018   Anisocoria 01/11/2019   Vitamin D deficiency 06/14/2019   Resolved Ambulatory Problems    Diagnosis Date Noted   Sinusitis 03/15/2012   General medical exam 03/15/2012   Asthma with acute exacerbation 03/18/2013   Urinary urgency 04/07/2016   Cervical polyp 04/07/2016   Sinusitis 07/26/2017   Past Medical History:  Diagnosis Date   Allergic rhinitis    Asthma    Depression    History of chicken pox     Family History  Problem Relation Age of Onset   Hyperlipidemia Mother    Rheum arthritis Father    Arthritis Father    Asthma Brother    Hyperlipidemia Brother    Depression Maternal Aunt        Bi-polar   Cancer Maternal Uncle        Lung Ca - 70's   Diabetes Paternal Aunt     Diabetes Paternal Uncle    Stroke Maternal Grandmother    Cancer Maternal Grandfather        Colon & prostate - 70's   Colon cancer Maternal Grandfather    Diabetes Paternal Grandmother    Breast cancer Neg Hx     Social History   Socioeconomic History   Marital status: Married    Spouse name: Not on file   Number of children: 2   Years of education: Not on file   Highest education level: Not on file  Occupational History   Occupation: Surveyor, minerals  Tobacco Use   Smoking status: Never   Smokeless tobacco: Never  Substance and Sexual Activity   Alcohol use: Yes    Comment: Occasional   Drug use: No   Sexual activity: Yes    Birth control/protection: Surgical    Comment: Partner had vasectomy   Other Topics Concern   Not on file  Social History Narrative   Lives in [place] with husband and 2 children. Works at [employer].      Regular Exercise -  Walk 2 to 3 times a week, 1.5 miles   Daily Caffeine Use:  Diet coke 2 to 3 16oz bottles                Social Determinants of Health   Financial Resource Strain: Not on file  Food Insecurity: Not on file  Transportation Needs: Not on file  Physical  Activity: Not on file  Stress: Not on file  Social Connections: Not on file  Intimate Partner Violence: Not on file    ROS  General:  Negative for nexplained weight loss, fever Skin: Negative for new or changing mole, sore that won't heal HEENT: Negative for trouble hearing, trouble seeing, ringing in ears, mouth sores, hoarseness, change in voice, dysphagia. CV:  Negative for chest pain, dyspnea, edema, palpitations Resp: Negative for cough, dyspnea, hemoptysis GI: Negative for nausea, vomiting, diarrhea, constipation, abdominal pain, melena, hematochezia. GU: Negative for dysuria, incontinence, urinary hesitance, hematuria, vaginal or penile discharge, polyuria, sexual difficulty, lumps in testicle or breasts MSK: Negative for muscle cramps or aches,  joint pain or swelling Neuro: Negative for headaches, weakness, numbness, dizziness, passing out/fainting Psych: Negative for depression, anxiety, memory problems  Objective  Physical Exam Vitals:   11/23/21 0926  BP: 110/80  Pulse: 80  Temp: 97.9 F (36.6 C)  SpO2: 98%    BP Readings from Last 3 Encounters:  11/23/21 110/80  06/12/19 120/70  01/11/19 98/64   Wt Readings from Last 3 Encounters:  11/23/21 154 lb 3.2 oz (69.9 kg)  06/12/19 144 lb (65.3 kg)  01/11/19 143 lb 4 oz (65 kg)    Physical Exam Constitutional:      General: She is not in acute distress.    Appearance: She is not diaphoretic.  HENT:     Head: Normocephalic and atraumatic.  Cardiovascular:     Rate and Rhythm: Normal rate and regular rhythm.     Heart sounds: Normal heart sounds.  Pulmonary:     Effort: Pulmonary effort is normal.     Breath sounds: Normal breath sounds.  Abdominal:     General: Bowel sounds are normal. There is no distension.     Palpations: Abdomen is soft.     Tenderness: There is no abdominal tenderness. There is no guarding or rebound.  Genitourinary:    Comments: Normal labia, normal vaginal mucosa, normal-appearing cervix, cervix is midline, no adnexal masses or tenderness Musculoskeletal:     Right lower leg: No edema.     Left lower leg: No edema.  Lymphadenopathy:     Cervical: No cervical adenopathy.  Skin:    General: Skin is warm and dry.  Neurological:     Mental Status: She is alert.  Psychiatric:        Mood and Affect: Mood normal.     Assessment/Plan:   Problem List Items Addressed This Visit     Hypertriglyceridemia   Relevant Orders   Comp Met (CMET)   Lipid panel   Routine general medical examination at a health care facility - Primary    Physical exam completed.  Encouraged healthy diet and continued exercise.  Pap smear completed today.  She will proceed with her colonoscopy as planned.  She knows she needs a mammogram around May.  She  will get her flu vaccine next week.  Shingrix vaccine given today.  She understands COVID vaccines are available if she changes her mind.  Hepatitis C and HIV screening completed today.  Lab work as outlined.      Vitamin D deficiency   Relevant Orders   Vitamin D (25 hydroxy)   Other Visit Diagnoses     Over weight       Relevant Orders   HgB A1c   Need for hepatitis C screening test       Relevant Orders   Hepatitis C Antibody   Encounter for  screening for HIV       Relevant Orders   HIV antibody (with reflex)   Cervical cancer screening       Relevant Orders   Cytology - PAP( )       Return in about 1 year (around 11/23/2022) for CPE.  This visit occurred during the SARS-CoV-2 public health emergency.  Safety protocols were in place, including screening questions prior to the visit, additional usage of staff PPE, and extensive cleaning of exam room while observing appropriate contact time as indicated for disinfecting solutions.    Tommi Rumps, MD [place]

## 2021-11-23 NOTE — Assessment & Plan Note (Signed)
Physical exam completed.  Encouraged healthy diet and continued exercise.  Pap smear completed today.  She will proceed with her colonoscopy as planned.  She knows she needs a mammogram around May.  She will get her flu vaccine next week.  Shingrix vaccine given today.  She understands COVID vaccines are available if she changes her mind.  Hepatitis C and HIV screening completed today.  Lab work as outlined.

## 2021-11-23 NOTE — Addendum Note (Signed)
Addended by: Charlyne Mom D on: 11/23/2021 10:11 AM   Modules accepted: Orders

## 2021-11-23 NOTE — Patient Instructions (Signed)
Nice to see you. Please try to eat a healthy diet and continue with your exercise level. Please keep your appointment for your colonoscopy. You need a follow-up mammogram in May. Please get your flu vaccine next week. We will let you know what your labs reveal.

## 2021-11-23 NOTE — Telephone Encounter (Signed)
Opened in error

## 2021-11-24 LAB — HIV ANTIBODY (ROUTINE TESTING W REFLEX): HIV 1&2 Ab, 4th Generation: NONREACTIVE

## 2021-11-24 LAB — HEPATITIS C ANTIBODY
Hepatitis C Ab: NONREACTIVE
SIGNAL TO CUT-OFF: <0.02 (ref <1.00)

## 2021-11-25 LAB — CYTOLOGY - PAP
Comment: NEGATIVE
Diagnosis: NEGATIVE
High risk HPV: NEGATIVE

## 2021-12-05 ENCOUNTER — Other Ambulatory Visit: Payer: Self-pay | Admitting: Family Medicine

## 2021-12-05 DIAGNOSIS — E559 Vitamin D deficiency, unspecified: Secondary | ICD-10-CM

## 2021-12-05 MED ORDER — VITAMIN D (ERGOCALCIFEROL) 1.25 MG (50000 UNIT) PO CAPS: 50000.0000 [IU] | ORAL_CAPSULE | ORAL | 0 refills | Status: DC

## 2022-01-27 LAB — HM COLONOSCOPY

## 2022-02-03 ENCOUNTER — Ambulatory Visit (INDEPENDENT_AMBULATORY_CARE_PROVIDER_SITE_OTHER): Payer: BC Managed Care – PPO | Admitting: *Deleted

## 2022-02-03 ENCOUNTER — Other Ambulatory Visit: Payer: Self-pay

## 2022-02-03 ENCOUNTER — Other Ambulatory Visit (INDEPENDENT_AMBULATORY_CARE_PROVIDER_SITE_OTHER): Payer: BC Managed Care – PPO

## 2022-02-03 DIAGNOSIS — Z23 Encounter for immunization: Secondary | ICD-10-CM

## 2022-02-03 DIAGNOSIS — E559 Vitamin D deficiency, unspecified: Secondary | ICD-10-CM

## 2022-02-03 NOTE — Progress Notes (Signed)
Pt arrived for 2nd dose of Shingrix vaccine, given in R deltoid. Pt tolerated injection well, showed no signs of distress nor voiced any concerns.

## 2022-02-04 LAB — VITAMIN D 25 HYDROXY (VIT D DEFICIENCY, FRACTURES): VITD: 40.57 ng/mL (ref 30.00–100.00)

## 2022-02-07 ENCOUNTER — Other Ambulatory Visit: Payer: Self-pay | Admitting: Family Medicine

## 2022-02-07 DIAGNOSIS — E559 Vitamin D deficiency, unspecified: Secondary | ICD-10-CM

## 2022-06-18 ENCOUNTER — Encounter: Payer: Self-pay | Admitting: Emergency Medicine

## 2022-06-18 ENCOUNTER — Other Ambulatory Visit: Payer: Self-pay

## 2022-06-18 ENCOUNTER — Emergency Department
Admission: EM | Admit: 2022-06-18 | Discharge: 2022-06-19 | Disposition: A | Discharge status: Home / Self Care | Payer: BC Managed Care – PPO | Attending: Emergency Medicine | Admitting: Emergency Medicine

## 2022-06-18 DIAGNOSIS — S0181XA Laceration without foreign body of other part of head, initial encounter: Secondary | ICD-10-CM | POA: Insufficient documentation

## 2022-06-18 DIAGNOSIS — W228XXA Striking against or struck by other objects, initial encounter: Secondary | ICD-10-CM | POA: Diagnosis not present

## 2022-06-18 DIAGNOSIS — S0990XA Unspecified injury of head, initial encounter: Secondary | ICD-10-CM | POA: Diagnosis present

## 2022-06-18 NOTE — ED Triage Notes (Signed)
Pt to ED via POV, states was hit in the head a tree limb while cutting it up. Pt denies LOC at this time. Pt with small laceration to forehead, bleeding controlled on arrival to triage.

## 2022-06-19 NOTE — Discharge Instructions (Signed)
You have had your superficial facial laceration repaired using wound glue. Keep the area clear of lotions, cream, or ointments.

## 2022-06-23 NOTE — ED Provider Notes (Signed)
[facility] Emergency Department Provider Note     Event Date/Time   First MD Initiated Contact with Patient 06/18/22 2224     (approximate)   History   Head Injury   HPI  Tanya Kramer is a 56 y.o. female resents to the ED via personal vehicle reporting being hit with a tree limb while cutting small branches up in the ER.  Patient denies any nausea, vomiting, or LOC.  Patient presents with a small semicircular laceration to the central forehead actively and at this time but she does endorse it took several hours of direct pressure to stop the bleeding.     Physical Exam   Triage Vital Signs: ED Triage Vitals [06/18/22 2105]  Enc Vitals Group     BP 123/77     Pulse Rate 96     Resp 20     Temp 98.1 F (36.7 C)     Temp Source Oral     SpO2 98 %     Weight 147 lb (66.7 kg)     Height 5\' 4"  (1.626 m)     Head Circumference      Peak Flow      Pain Score 0     Pain Loc      Pain Edu?      Excl. in GC?     Most recent vital signs: Vitals:   06/18/22 2105  BP: 123/77  Pulse: 96  Resp: 20  Temp: 98.1 F (36.7 C)  SpO2: 98%    General Awake, no distress.  HEENT NCAT, except for a 1 cm semicircular superficial laceration to the central forehead.  No active bleeding at this time.  No wound dehiscence is appreciated.  PERRL. EOMI. No rhinorrhea. Mucous membranes are moist.  CV:  Good peripheral perfusion.  RESP:  Normal effort.  ABD:  No distention.    ED Results / Procedures / Treatments   Labs (all labs ordered are listed, but only abnormal results are displayed) Labs Reviewed - No data to display   EKG   RADIOLOGY   No results found.   PROCEDURES:  Critical Care performed: No  ..Laceration Repair  Date/Time: 06/19/2022 12:25 AM  Performed by: 08/20/2022, PA-C Authorized by: Lissa Hoard, PA-C   Consent:    Consent obtained:  Verbal   Consent given by:  Patient   Risks, benefits, and  alternatives were discussed: yes     Risks discussed:  Pain and poor cosmetic result   Alternatives discussed:  No treatment Universal protocol:    Site/side marked: yes     Patient identity confirmed:  Verbally with patient Anesthesia:    Anesthesia method:  Topical application   Topical anesthetic:  Tetracaine gel Laceration details:    Location:  Face   Face location:  Forehead   Length (cm):  1   Depth (mm):  5 Pre-procedure details:    Preparation:  Patient was prepped and draped in usual sterile fashion Exploration:    Limited defect created (wound extended): no     Hemostasis achieved with:  Direct pressure   Contaminated: no   Treatment:    Area cleansed with:  Saline   Amount of cleaning:  Standard   Irrigation solution:  Sterile saline   Visualized foreign bodies/material removed: no     Debridement:  None   Undermining:  None   Scar revision: no   Skin repair:  Repair method:  Tissue adhesive Approximation:    Approximation:  Close Repair type:    Repair type:  Simple Post-procedure details:    Dressing:  Open (no dressing)   Procedure completion:  Tolerated well, no immediate complications  MEDICATIONS ORDERED IN ED: Medications - No data to display   IMPRESSION / MDM / ASSESSMENT AND PLAN / ED COURSE  I reviewed the triage vital signs and the nursing notes.                              Differential diagnosis includes, but is not limited to, head contusion, minor head injury, concussion, facial laceration  Patient's presentation is most consistent with acute, uncomplicated illness.  Patient's diagnosis is consistent with facial contusion and laceration. Patient will be discharged home with wound care instructions. Patient is to follow up with her primary provider as needed or otherwise directed. Patient is given ED precautions to return to the ED for any worsening or new symptoms.     FINAL CLINICAL IMPRESSION(S) / ED DIAGNOSES   Final diagnoses:   Minor head injury, initial encounter  Facial laceration, initial encounter     Rx / DC Orders   ED Discharge Orders     None        Note:  This document was prepared using Dragon voice recognition software and may include unintentional dictation errors.    Lissa Hoard, PA-C 06/23/22 2354    Minna Antis, MD 06/24/22 1511

## 2022-11-25 ENCOUNTER — Telehealth: Payer: Self-pay | Admitting: Family Medicine

## 2022-11-25 ENCOUNTER — Ambulatory Visit (INDEPENDENT_AMBULATORY_CARE_PROVIDER_SITE_OTHER): Payer: BC Managed Care – PPO | Admitting: Family Medicine

## 2022-11-25 ENCOUNTER — Encounter: Payer: Self-pay | Admitting: Family Medicine

## 2022-11-25 VITALS — BP 113/76 | HR 81 | Temp 98.3°F | Ht 64.0 in | Wt 150.2 lb

## 2022-11-25 DIAGNOSIS — E781 Pure hyperglyceridemia: Secondary | ICD-10-CM

## 2022-11-25 DIAGNOSIS — Z23 Encounter for immunization: Secondary | ICD-10-CM

## 2022-11-25 DIAGNOSIS — E663 Overweight: Secondary | ICD-10-CM | POA: Diagnosis not present

## 2022-11-25 DIAGNOSIS — E559 Vitamin D deficiency, unspecified: Secondary | ICD-10-CM | POA: Diagnosis not present

## 2022-11-25 DIAGNOSIS — Z0001 Encounter for general adult medical examination with abnormal findings: Secondary | ICD-10-CM

## 2022-11-25 DIAGNOSIS — R197 Diarrhea, unspecified: Secondary | ICD-10-CM | POA: Insufficient documentation

## 2022-11-25 DIAGNOSIS — R928 Other abnormal and inconclusive findings on diagnostic imaging of breast: Secondary | ICD-10-CM

## 2022-11-25 LAB — COMPREHENSIVE METABOLIC PANEL
ALT: 19 U/L (ref 0–35)
AST: 17 U/L (ref 0–37)
Albumin: 4.5 g/dL (ref 3.5–5.2)
Alkaline Phosphatase: 74 U/L (ref 39–117)
BUN: 9 mg/dL (ref 6–23)
CO2: 27 mEq/L (ref 19–32)
Calcium: 9.6 mg/dL (ref 8.4–10.5)
Chloride: 104 mEq/L (ref 96–112)
Creatinine, Ser: 0.68 mg/dL (ref 0.40–1.20)
GFR: 98.7 mL/min (ref >60.00)
Glucose, Bld: 100 mg/dL — ABNORMAL HIGH (ref 70–99)
Potassium: 4.4 mEq/L (ref 3.5–5.1)
Sodium: 139 mEq/L (ref 135–145)
Total Bilirubin: 0.5 mg/dL (ref 0.2–1.2)
Total Protein: 7 g/dL (ref 6.0–8.3)

## 2022-11-25 LAB — LIPID PANEL
Cholesterol: 211 mg/dL — ABNORMAL HIGH (ref 0–200)
HDL: 58.2 mg/dL (ref >39.00)
NonHDL: 152.68
Total CHOL/HDL Ratio: 4
Triglycerides: 234 mg/dL — ABNORMAL HIGH (ref 0.0–149.0)
VLDL: 46.8 mg/dL — ABNORMAL HIGH (ref 0.0–40.0)

## 2022-11-25 LAB — HEMOGLOBIN A1C: Hgb A1c MFr Bld: 5.6 % (ref 4.6–6.5)

## 2022-11-25 LAB — LDL CHOLESTEROL, DIRECT: Direct LDL: 108 mg/dL

## 2022-11-25 LAB — VITAMIN D 25 HYDROXY (VIT D DEFICIENCY, FRACTURES): VITD: 34.14 ng/mL (ref 30.00–100.00)

## 2022-11-25 MED ORDER — COLESTIPOL HCL 1 G PO TABS: 2.0000 g | ORAL_TABLET | Freq: Every day | ORAL | 1 refills | Status: DC

## 2022-11-25 NOTE — Assessment & Plan Note (Signed)
Likely related to having her gallbladder removed.  Will give her a trial of colestipol.  If this is not helpful we could have her see GI.

## 2022-11-25 NOTE — Telephone Encounter (Signed)
Lft pt vm to call ofc . thanks 

## 2022-11-25 NOTE — Assessment & Plan Note (Signed)
Physical exam completed.  Encouraged healthy diet and exercise.  Pap smear and colonoscopy are up-to-date.  Patient is due for mammogram.  I am not sure why they would not have contacted her to schedule a follow-up diagnostic though we will get this ordered today.  She declines further COVID vaccinations.  Lab work as outlined.

## 2022-11-25 NOTE — Progress Notes (Signed)
Tommi Rumps, MD Phone: [phone]  Tanya Kramer is a 56 y.o. female who presents today for CPE.  Diet: not good with the holidays, prior to the holidays was eating meat, vegetables, nuts, fruit, portions were probably not the best Exercise: walks 3-4x/week 30-40 minutes Pap smear: 11/23/21 NILM neg HPV Colonoscopy: 01/27/22 7 year recall Mammogram: due, was due for 6 month diagnostic mammogram 7 months ago though she notes they never contacted her for this Family history-  Colon cancer: maternal grandfather  Breast cancer: no  Ovarian cancer: no Menses: postmenopausal Vaccines-   Flu: due  Tetanus: UTD  Shingles: UTD  COVID19: x2 HIV screening: UTD Hep C Screening: UTD Tobacco use: no Alcohol use: no Illicit Drug use: no Dentist: yes Ophthalmology: yes  Diarrhea: This is a chronic issue.  She notes has been going on since she had her gallbladder taken out 12 to 13 years ago.  She wonders about taking a medication for this.  She notes one of her friends was started on colestipol and that has been quite helpful for them.   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   Vitamin D deficiency 06/14/2019   Diarrhea 11/25/2022   Resolved Ambulatory Problems    Diagnosis Date Noted   Sinusitis 03/15/2012   General medical exam 03/15/2012   Asthma with acute exacerbation 03/18/2013   Urinary urgency 04/07/2016   Cervical polyp 04/07/2016   Sinusitis 07/26/2017   Past Medical History:   Diagnosis Date   Allergic rhinitis    Asthma    Depression    History of chicken pox     Family History  Problem Relation Age of Onset   Hyperlipidemia Mother    Rheum arthritis Father    Arthritis Father    Asthma Brother    Hyperlipidemia Brother    Depression Maternal Aunt        Bi-polar   Cancer Maternal Uncle        Lung Ca - 70's   Diabetes Paternal Aunt    Diabetes Paternal Uncle    Stroke Maternal Grandmother    Cancer Maternal Grandfather        Colon & prostate - 70's   Colon cancer Maternal Grandfather    Diabetes Paternal Grandmother    Breast cancer Neg Hx     Social History   Socioeconomic History   Marital status: Married    Spouse name: Not on file   Number of children: 2   Years of education: Not on file   Highest education level: Not on file  Occupational History   Occupation: Surveyor, minerals  Tobacco Use   Smoking status: Never   Smokeless tobacco: Never  Substance and Sexual Activity   Alcohol use: Yes    Comment: Occasional   Drug use: No   Sexual activity: Yes    Birth control/protection: Surgical    Comment: Partner had vasectomy   Other Topics Concern   Not on file  Social History Narrative   Lives in [place] with husband and 2 children. Works at [employer]  Elem.      Regular Exercise -  Walk 2 to 3 times a week, 1.5 miles   Daily Caffeine Use:  Diet coke 2 to 3 16oz bottles                Social Determinants of Health   Financial Resource Strain: Not on file  Food Insecurity: Not on file  Transportation Needs: Not on file  Physical Activity: Not on file  Stress: Not on file  Social Connections: Not on file  Intimate Partner Violence: Not on file    ROS  General:  Negative for nexplained weight loss, fever Skin: Negative for new or changing mole, sore that won't heal HEENT: Negative for trouble hearing, trouble seeing, ringing in ears, mouth sores, hoarseness, change in voice, dysphagia. CV:  Negative for  chest pain, dyspnea, edema, palpitations Resp: Negative for cough, dyspnea, hemoptysis GI: Positive constipation, Negative for nausea, vomiting, diarrhea, abdominal pain, melena, hematochezia. GU: Negative for dysuria, incontinence, urinary hesitance, hematuria, vaginal or penile discharge, polyuria, sexual difficulty, lumps in testicle or breasts MSK: Negative for muscle cramps or aches, joint pain or swelling Neuro: Negative for headaches, weakness, numbness, dizziness, passing out/fainting Psych: Negative for depression, anxiety, memory problems  Objective  Physical Exam Vitals:   11/25/22 0836  BP: 113/76  Pulse: 81  Temp: 98.3 F (36.8 C)  SpO2: 99%    BP Readings from Last 3 Encounters:  11/25/22 113/76  06/18/22 123/77  11/23/21 110/80   Wt Readings from Last 3 Encounters:  11/25/22 150 lb 3.2 oz (68.1 kg)  06/18/22 147 lb (66.7 kg)  11/23/21 154 lb 3.2 oz (69.9 kg)    Physical Exam Constitutional:      General: She is not in acute distress.    Appearance: She is not diaphoretic.  HENT:     Head: Normocephalic and atraumatic.  Cardiovascular:     Rate and Rhythm: Normal rate and regular rhythm.     Heart sounds: Normal heart sounds.  Pulmonary:     Effort: Pulmonary effort is normal.     Breath sounds: Normal breath sounds.  Abdominal:     General: Bowel sounds are normal. There is no distension.     Palpations: Abdomen is soft.     Tenderness: There is no abdominal tenderness.  Musculoskeletal:     Right lower leg: No edema.     Left lower leg: No edema.  Lymphadenopathy:     Cervical: No cervical adenopathy.  Skin:    General: Skin is warm and dry.  Neurological:     Mental Status: She is alert.  Psychiatric:        Mood and Affect: Mood normal.      Assessment/Plan:   Problem List Items Addressed This Visit     Diarrhea    Likely related to having her gallbladder removed.  Will give her a trial of colestipol.  If this is not helpful we  could have her see GI.      Relevant Medications   colestipol (COLESTID) 1 g tablet   Encounter for general adult medical examination with abnormal findings - Primary    Physical exam completed.  Encouraged healthy diet and exercise.  Pap smear and colonoscopy are up-to-date.  Patient is due for mammogram.  I am not sure why they would not have contacted her to schedule a follow-up diagnostic though we will get this ordered today.  She declines further COVID vaccinations.  Lab work as outlined.  Hypertriglyceridemia   Relevant Medications   colestipol (COLESTID) 1 g tablet   Other Relevant Orders   Comp Met (CMET)   Lipid panel   Vitamin D deficiency   Relevant Orders   Vitamin D (25 hydroxy)   Other Visit Diagnoses     Need for immunization against influenza       Relevant Orders   Flu Vaccine QUAD 43moIM (Fluarix, Fluzone & Alfiuria Quad PF) (Completed)   Overweight       Relevant Orders   HgB A1c   Abnormal mammogram       Relevant Orders   MM DIAG BREAST TOMO BILATERAL   [place] LTD UNI LEFT INC AXILLA   [place] LTD UNI RIGHT INC AXILLA       Return in about 1 year (around 11/26/2023) for CPE.   ETommi Rumps MD LSchlater

## 2022-11-25 NOTE — Patient Instructions (Signed)
Nice to see you. We will try the colestipol to see if that helps with your diarrhea.  If the current dose is not helpful please let me know. If you do not hear from the breast center in the next week your mammogram please call [phone] to get this scheduled.

## 2022-12-05 IMAGING — US US BREAST*R* LIMITED INC AXILLA
1 series · 8 of 8 positions shown · non-contrast
Comparison: Previous exam(s).

CLINICAL DATA: 54-year-old female presenting as a recall from
screening for possible right breast asymmetry.

EXAM:
DIGITAL DIAGNOSTIC UNILATERAL RIGHT MAMMOGRAM WITH TOMOSYNTHESIS AND
CAD; ULTRASOUND RIGHT BREAST LIMITED
TECHNIQUE: Right digital diagnostic mammography and breast tomosynthesis was
performed. The images were evaluated with computer-aided detection.;
Targeted ultrasound examination of the right breast was performed

[Series 1: us breast*right* limited inc axilla · 0.06mm/px · 8 of 8 slices shown]
[im 1/8]
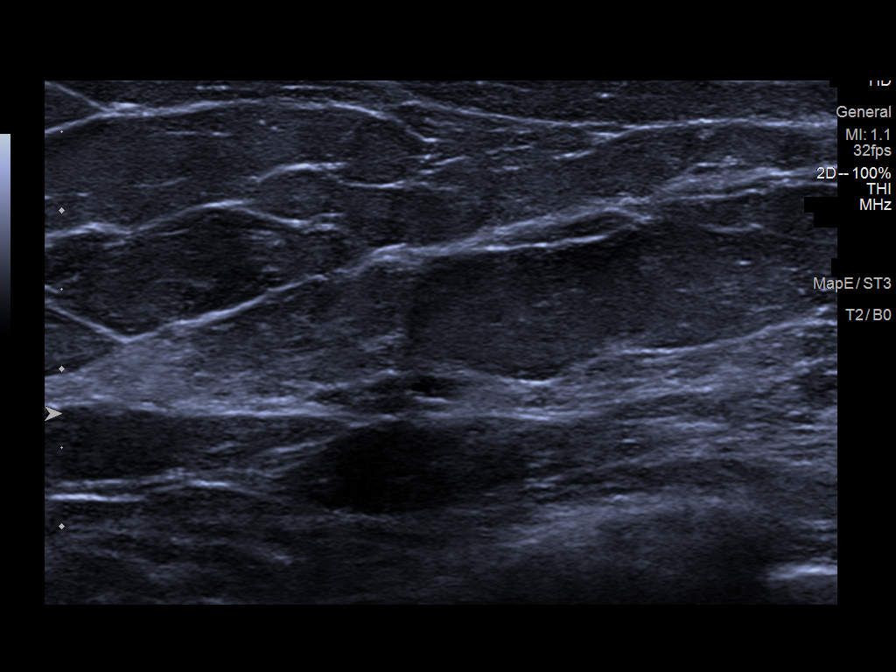
[im 2/8]
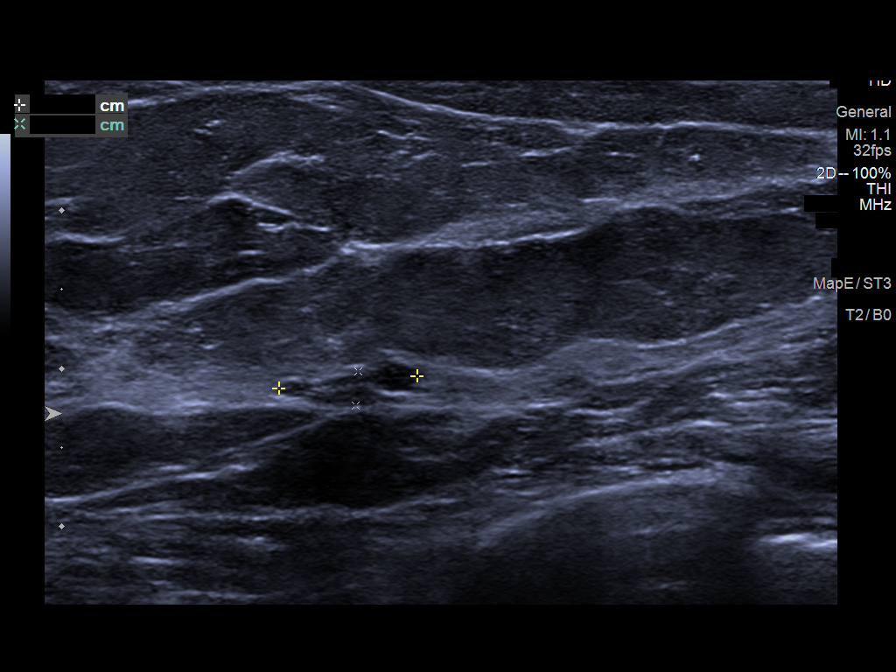
[im 3/8]
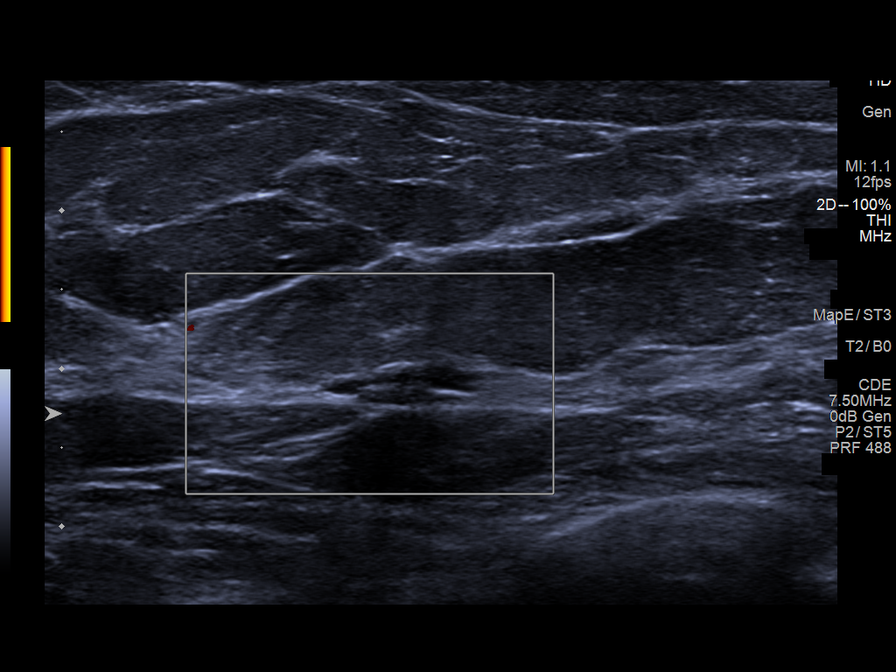
[im 4/8]
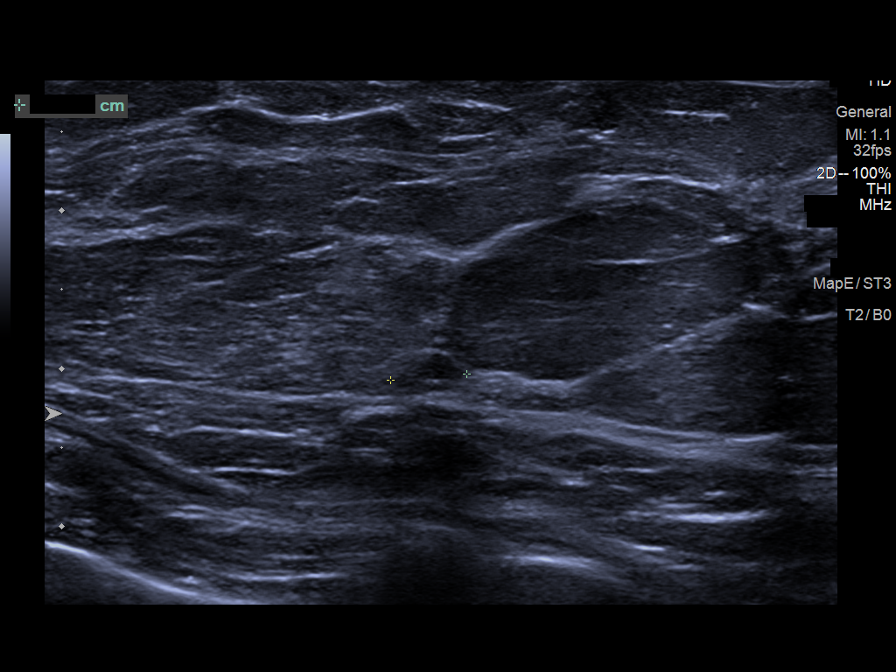
[im 5/8]
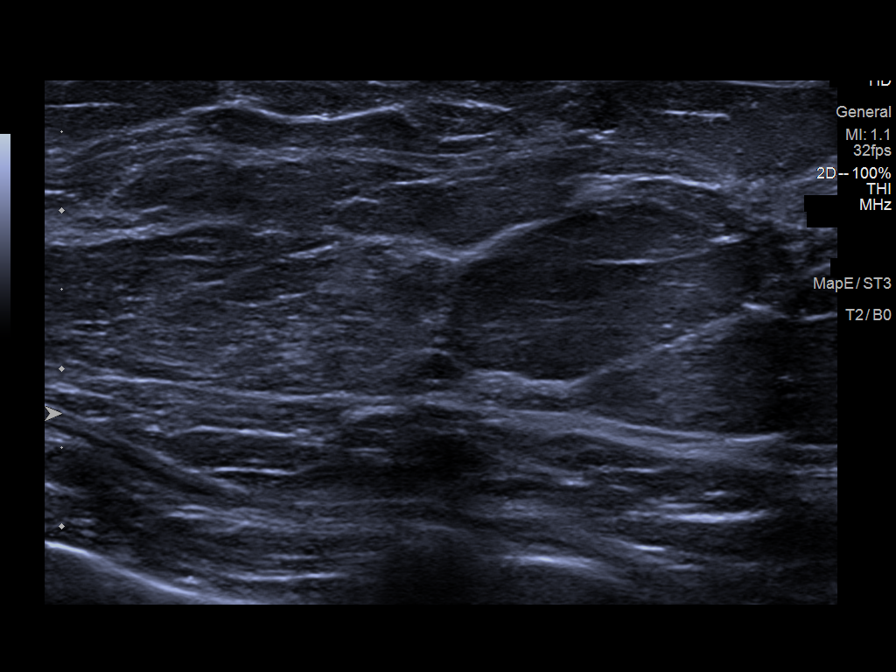
[im 6/8]
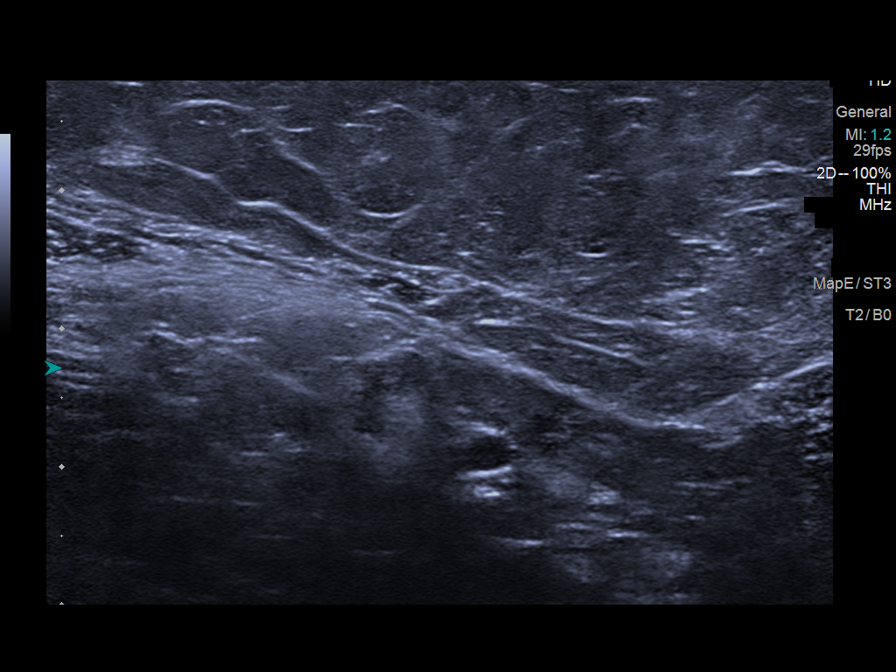
[im 7/8]
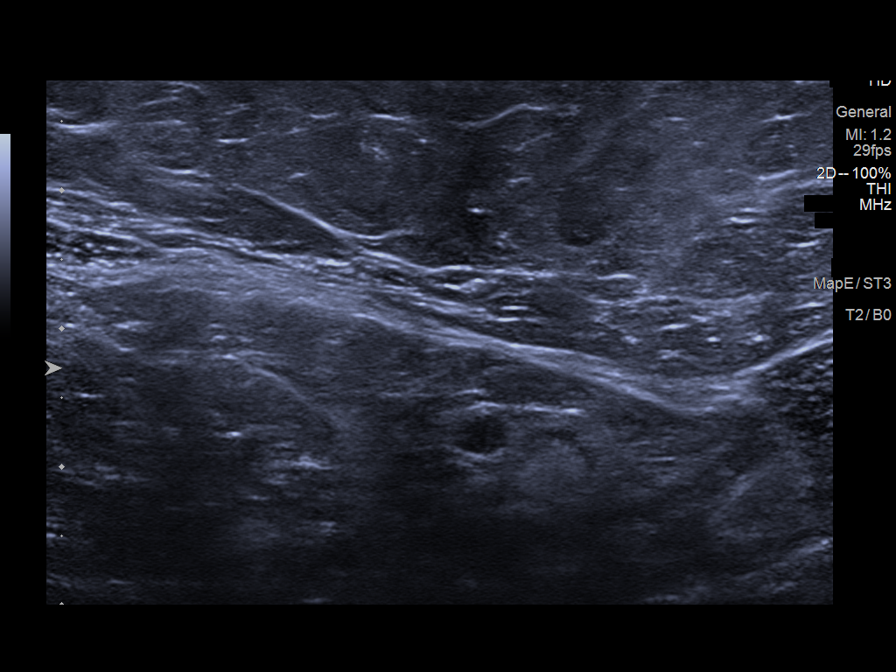
[im 8/8]
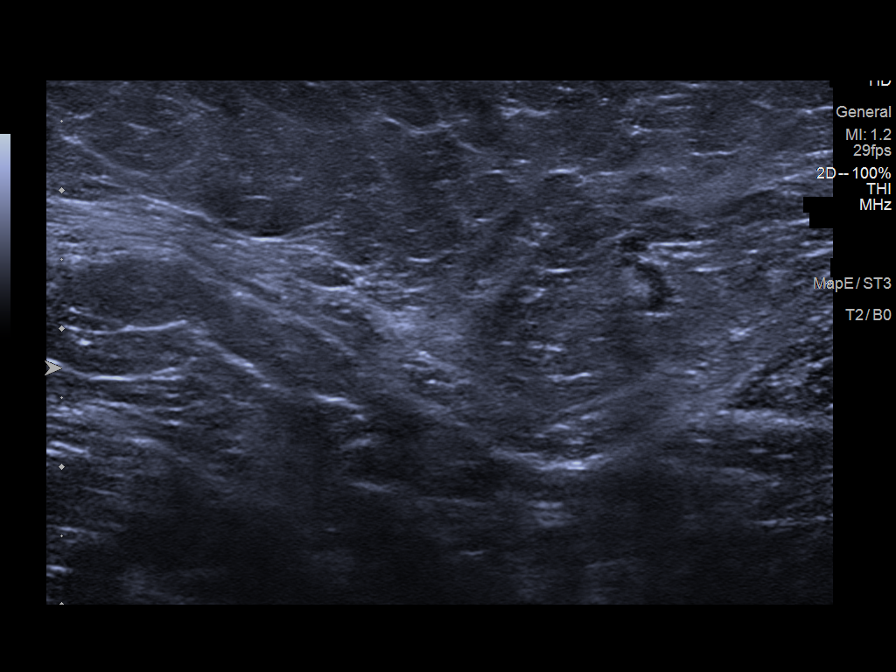

[8 of 8 positions shown; findings below may reference images not displayed]

ACR Breast Density Category b: There are scattered areas of
fibroglandular density.
FINDINGS: Mammogram:

Full field cc and mL tomosynthesis views as well as spot compression
cc tomosynthesis views of the right breast were performed for a
questioned asymmetry seen only on the cc view in the slightly outer
right breast. There is a small focal asymmetry/mass that persists on
the spot cc views and on the full field mL tomosynthesis views
measuring approximately 0.7 cm. The asymmetry is less conspicuous on
the full field cc view. There are no additional new findings
elsewhere in the right breast.

Ultrasound:

Targeted ultrasound is performed in the right breast at 9 o'clock 3
cm from the nipple demonstrating a cluster of cysts measuring 0.9 x
0.2 x 0.5 cm. No internal vascularity. This likely corresponds to
the mammographic finding. Targeted ultrasound the right axilla
demonstrates normal lymph nodes.
IMPRESSION: Probably benign cluster of cysts in the right breast at 9 o'clock.

RECOMMENDATION:
Diagnostic right breast mammogram and ultrasound in 6 months.

I have discussed the findings and recommendations with the patient.
If applicable, a reminder letter will be sent to the patient
regarding the next appointment.

BI-RADS CATEGORY  3: Probably benign.

## 2022-12-15 ENCOUNTER — Ambulatory Visit
Admission: RE | Admit: 2022-12-15 | Discharge: 2022-12-15 | Disposition: A | Discharge status: Home / Self Care | Payer: BC Managed Care – PPO | Source: Ambulatory Visit | Attending: Family Medicine | Admitting: Family Medicine

## 2022-12-15 DIAGNOSIS — R928 Other abnormal and inconclusive findings on diagnostic imaging of breast: Secondary | ICD-10-CM

## 2022-12-19 ENCOUNTER — Other Ambulatory Visit: Payer: Self-pay | Admitting: Family Medicine

## 2022-12-19 DIAGNOSIS — R197 Diarrhea, unspecified: Secondary | ICD-10-CM

## 2023-02-22 ENCOUNTER — Telehealth: Payer: BC Managed Care – PPO | Admitting: Physician Assistant

## 2023-02-22 DIAGNOSIS — B001 Herpesviral vesicular dermatitis: Secondary | ICD-10-CM | POA: Diagnosis not present

## 2023-02-22 MED ORDER — VALACYCLOVIR HCL 1 G PO TABS: 2000.0000 mg | ORAL_TABLET | Freq: Two times a day (BID) | ORAL | 0 refills | Status: AC

## 2023-02-22 NOTE — Progress Notes (Signed)
We are sorry that you are not feeling well.  Here is how we plan to help!  Based on what you have shared with me it does look like you have a viral infection.    Most cold sores or fever blisters are small fluid filled blisters around the mouth caused by herpes simplex virus.  The most common strain of the virus causing cold sores is herpes simplex virus 1.  It can be spread by skin contact, sharing eating utensils, or even sharing towels.  Cold sores are contagious to other people until dry. (Approximately 5-7 days).  Wash your hands. You can spread the virus to your eyes through handling your contact lenses after touching the lesions.  Most people experience pain at the sight or tingling sensations in their lips that may begin before the ulcers erupt.  Herpes simplex is treatable but not curable.  It may lie dormant for a long time and then reappear due to stress or prolonged sun exposure.  Many patients have success in treating their cold sores with an over the counter topical called Abreva.  You may apply the cream up to 5 times daily (maximum 10 days) until healing occurs.  If you would like to use an oral antiviral medication to speed the healing of your cold sore, I have sent a prescription to your local pharmacy Valacyclovir 2 gm take one by mouth twice a day for 1 day    HOME CARE:  Wash your hands frequently. Do not pick at or rub the sore. Don't open the blisters. Avoid kissing other people during this time. Avoid sharing drinking glasses, eating utensils, or razors. Do not handle contact lenses unless you have thoroughly washed your hands with soap and warm water! Avoid oral sex during this time.  Herpes from sores on your mouth can spread to your partner's genital area. Avoid contact with anyone who has eczema or a weakened immune system. Cold sores are often triggered by exposure to intense sunlight, use a lip balm containing a sunscreen (SPF 30 or higher).  GET HELP RIGHT AWAY  IF:  Blisters look infected. Blisters occur near or in the eye. Symptoms last longer than 10 days. Your symptoms become worse.  MAKE SURE YOU:  Understand these instructions. Will watch your condition. Will get help right away if you are not doing well or get worse.    Your e-visit answers were reviewed by a board certified advanced clinical practitioner to complete your personal care plan.  Depending upon the condition, your plan could have  Included both over the counter or prescription medications.    Please review your pharmacy choice.  Be sure that the pharmacy you have chosen is open so that you can pick up your prescription now.  If there is a problem you can message your provider in MyChart to have the prescription routed to another pharmacy.    Your safety is important to us.  If you have drug allergies check our prescription carefully.  For the next 24 hours you can use MyChart to ask questions about today's visit, request a non-urgent call back, or ask for a work or school excuse from your e-visit provider.  You will get an email in the next two days asking about your experience.  I hope that your e-visit has been valuable and will speed your recovery.  I have spent 5 minutes in review of e-visit questionnaire, review and updating patient chart, medical decision making and response to patient.     Brice Kossman M Hassen Bruun, PA-C  

## 2023-10-09 ENCOUNTER — Ambulatory Visit: Payer: BC Managed Care – PPO | Admitting: Podiatry

## 2023-10-13 ENCOUNTER — Ambulatory Visit: Payer: BC Managed Care – PPO | Admitting: Podiatry

## 2023-10-16 ENCOUNTER — Ambulatory Visit (INDEPENDENT_AMBULATORY_CARE_PROVIDER_SITE_OTHER): Payer: BC Managed Care – PPO

## 2023-10-16 ENCOUNTER — Encounter: Payer: Self-pay | Admitting: Podiatry

## 2023-10-16 ENCOUNTER — Ambulatory Visit (INDEPENDENT_AMBULATORY_CARE_PROVIDER_SITE_OTHER): Payer: BC Managed Care – PPO | Admitting: Podiatry

## 2023-10-16 VITALS — BP 125/73 | HR 83

## 2023-10-16 DIAGNOSIS — M2012 Hallux valgus (acquired), left foot: Secondary | ICD-10-CM

## 2023-10-16 NOTE — Patient Instructions (Signed)
VISIT SUMMARY:  Today, we discussed the recurrent pain in your right big toe, which has been bothering you, especially when wearing shoes other than tennis shoes. We reviewed your history of surgery on the same toe and examined the current symptoms. An X-ray was performed to check for any bone growth.  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 Your X-ray shows a small bone formation on the top of your right big toe. We recommend using a silicone toe cap to cushion and relieve pressure. If the pain does not improve in a few weeks, we may need to do an MRI to get a better look at the bone growth. Depending on the MRI results, we might discuss surgical options to remove the growth.  -INGROWN TOENAIL: An ingrown toenail occurs when the edge of the nail grows into the skin, causing pain and discomfort. This might be contributing to your toe pain. Please monitor the pain and pressure around the nail. If it continues to bother you, we will explore treatment options for the ingrown toenail.  INSTRUCTIONS:  Please use the silicone toe cap as recommended and monitor your symptoms. If there is no improvement in a few weeks, we will schedule an MRI. Additionally, keep an eye on the pain and pressure around your toenail and let us know if it persists.

## 2023-10-16 NOTE — Progress Notes (Signed)
  Subjective:  Patient ID: Tanya Kramer, female    DOB: [birth year],  MRN: [id number]  No chief complaint on file.   Discussed the use of AI scribe software for clinical note transcription with the patient, who gave verbal consent to proceed.  History of Present Illness   The patient, with a history of right big toe surgery 10-14 years ago, presents with recurrent right big toe pain. The pain is localized to the dorsal aspect of the toe and is exacerbated by wearing shoes, except for tennis shoes. The pain is described as a soreness, and the patient denies any recent injuries. The initial surgery was performed due to a bone growth following an injury where a can was dropped on the toe. The patient reports a sensation of numbness in the toe at times. The patient denies any other symptoms.          Objective:    Physical Exam   EXTREMITIES: Right foot warm, well perfused with good capillary fill time. MUSCULOSKELETAL: Slightly thickened, discolored, dystrophic toenail on right hallux with slight incurvation of the medial border. Pain with dorsal compression. No pain on the ingrown portion.       No images are attached to the encounter.    Results   RADIOLOGY Right foot x-ray: Minimal dorsal bone formation at the distal tuft. No significant bony tumor noted. (10/16/2023)      Assessment:   1. Hallux valgus of left foot      Plan:  Patient was evaluated and treated and all questions answered.  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 An X-ray reveals a small dorsal bone formation at the distal tuft without a large bony tumor. We will try a silicone toe cap for additional cushioning and pressure relief. If there is no improvement in a few weeks, we will consider ordering an MRI to evaluate the size and depth  of the possible osteochondroma. Should the MRI confirm osteochondroma, we will discuss surgical options, including resection from under the skin or through the top with temporary nail removal.  Ingrown Toenail   The ingrown toenail may contribute to her discomfort. We advise her to monitor for pain and pressure on the nail. If symptoms persist, we will consider treatment options for the ingrown toenail.          Return if symptoms worsen or fail to improve.

## 2023-11-27 ENCOUNTER — Encounter: Payer: BC Managed Care – PPO | Admitting: Family Medicine

## 2023-12-07 ENCOUNTER — Telehealth: Payer: BC Managed Care – PPO | Admitting: Physician Assistant

## 2023-12-07 DIAGNOSIS — B001 Herpesviral vesicular dermatitis: Secondary | ICD-10-CM

## 2023-12-07 MED ORDER — VALACYCLOVIR HCL 1 G PO TABS: 2000.0000 mg | ORAL_TABLET | Freq: Two times a day (BID) | ORAL | 0 refills | Status: AC

## 2023-12-07 NOTE — Progress Notes (Signed)
E-Visit for [employer]  We are sorry that you are not feeling well.  Here is how we plan to help!  Based on what you have shared with me it does look like you have a viral infection.    Most cold sores or fever blisters are small fluid filled blisters around the mouth caused by herpes simplex virus.  The most common strain of the virus causing cold sores is herpes simplex virus 1.  It can be spread by skin contact, sharing eating utensils, or even sharing towels.  Cold sores are contagious to other people until dry. (Approximately 5-7 days).  Wash your hands. You can spread the virus to your eyes through handling your contact lenses after touching the lesions.  Most people experience pain at the sight or tingling sensations in their lips that may begin before the ulcers erupt.  Herpes simplex is treatable but not curable.  It may lie dormant for a long time and then reappear due to stress or prolonged sun exposure.  Many patients have success in treating their cold sores with an over the counter topical called Abreva.  You may apply the cream up to 5 times daily (maximum 10 days) until healing occurs.  If you would like to use an oral antiviral medication to speed the healing of your cold sore, I have sent a prescription to your local pharmacy Valacyclovir 2 gm take one by mouth twice a day for 1 day    HOME CARE:  Wash your hands frequently. Do not pick at or rub the sore. Don't open the blisters. Avoid kissing other people during this time. Avoid sharing drinking glasses, eating utensils, or razors. Do not handle contact lenses unless you have thoroughly washed your hands with soap and warm water! Avoid oral sex during this time.  Herpes from sores on your mouth can spread to your partner's genital area. Avoid contact with anyone who has eczema or a weakened immune system. Cold sores are often triggered by exposure to intense sunlight, use a lip balm containing a sunscreen (SPF 30 or  higher).  GET HELP RIGHT AWAY IF:  Blisters look infected. Blisters occur near or in the eye. Symptoms last longer than 10 days. Your symptoms become worse.  MAKE SURE YOU:  Understand these instructions. Will watch your condition. Will get help right away if you are not doing well or get worse.    Your e-visit answers were reviewed by a board certified advanced clinical practitioner to complete your personal care plan.  Depending upon the condition, your plan could have  Included both over the counter or prescription medications.    Please review your pharmacy choice.  Be sure that the pharmacy you have chosen is open so that you can pick up your prescription now.  If there is a problem you can message your provider in MyChart to have the prescription routed to another pharmacy.    Your safety is important to [place].  If you have drug allergies check our prescription carefully.  For the next 24 hours you can use MyChart to ask questions about today's visit, request a non-urgent call back, or ask for a work or school excuse from your e-visit provider.  You will get an email in the next two days asking about your experience.  I hope that your e-visit has been valuable and will speed your recovery.

## 2023-12-07 NOTE — Progress Notes (Signed)
I have spent 5 minutes in review of e-visit questionnaire, review and updating patient chart, medical decision making and response to patient.   Mia Milan Cody Jacklynn Dehaas, PA-C    

## 2024-01-11 ENCOUNTER — Encounter: Payer: 59 | Admitting: Family Medicine

## 2024-01-15 ENCOUNTER — Other Ambulatory Visit: Payer: Self-pay | Admitting: Family Medicine

## 2024-01-15 DIAGNOSIS — Z1231 Encounter for screening mammogram for malignant neoplasm of breast: Secondary | ICD-10-CM

## 2024-01-19 ENCOUNTER — Encounter: Payer: 59 | Admitting: Family Medicine

## 2024-01-23 ENCOUNTER — Ambulatory Visit
Admission: RE | Admit: 2024-01-23 | Discharge: 2024-01-23 | Disposition: A | Discharge status: Home / Self Care | Payer: 59 | Source: Ambulatory Visit | Attending: Family Medicine | Admitting: Family Medicine

## 2024-01-23 DIAGNOSIS — Z1231 Encounter for screening mammogram for malignant neoplasm of breast: Secondary | ICD-10-CM | POA: Insufficient documentation

## 2024-01-29 ENCOUNTER — Telehealth: Payer: 59 | Admitting: Physician Assistant

## 2024-01-29 DIAGNOSIS — B001 Herpesviral vesicular dermatitis: Secondary | ICD-10-CM

## 2024-01-29 MED ORDER — VALACYCLOVIR HCL 1 G PO TABS: 2000.0000 mg | ORAL_TABLET | Freq: Two times a day (BID) | ORAL | 0 refills | Status: AC

## 2024-01-29 NOTE — Progress Notes (Signed)

## 2024-01-29 NOTE — Progress Notes (Signed)
 I have spent 5 minutes in review of e-visit questionnaire, review and updating patient chart, medical decision making and response to patient.   Piedad Climes, PA-C

## 2024-02-07 ENCOUNTER — Encounter: Payer: Self-pay | Admitting: Family Medicine

## 2024-02-08 ENCOUNTER — Telehealth: Payer: 59 | Admitting: Nurse Practitioner

## 2024-02-08 ENCOUNTER — Encounter: Payer: Self-pay | Admitting: Nurse Practitioner

## 2024-02-08 VITALS — Ht 64.0 in | Wt 148.0 lb

## 2024-02-08 DIAGNOSIS — B001 Herpesviral vesicular dermatitis: Secondary | ICD-10-CM | POA: Diagnosis not present

## 2024-02-08 MED ORDER — VALACYCLOVIR HCL 1 G PO TABS: 2000.0000 mg | ORAL_TABLET | Freq: Two times a day (BID) | ORAL | 0 refills | Status: AC

## 2024-02-08 MED ORDER — VALACYCLOVIR HCL 500 MG PO TABS: 500.0000 mg | ORAL_TABLET | Freq: Every day | ORAL | 1 refills | Status: DC

## 2024-02-08 NOTE — Telephone Encounter (Signed)
 Pt is being seen by Bethanie Dicker, NP today

## 2024-02-08 NOTE — Progress Notes (Signed)
 MyChart Video Visit    Virtual Visit via Video Note   This visit type was conducted because this format is felt to be most appropriate for this patient at this time. Physical exam was limited by quality of the video and audio technology used for the visit. CMA was able to get the patient set up on a video visit.  Patient location: Home. Patient and provider in visit Provider location: Office  I discussed the limitations of evaluation and management by telemedicine and the availability of in person appointments. The patient expressed understanding and agreed to proceed.  Visit Date: 02/08/2024  Today's healthcare provider: Bethanie Dicker, NP     Subjective:    Patient ID: Tanya Kramer, female    DOB: 18-Jan-1966, 58 y.o.   MRN: [id number]  Chief Complaint  Patient presents with   Medication Refill    HPI  Discussed the use of AI scribe software for clinical note transcription with the patient, who gave verbal consent to proceed.  History of Present Illness   Tanya Kramer is a 58 year old female who presents for recurrent cold sores.  She has recurrent herpes labialis, with more frequent outbreaks since receiving the shingles vaccine in March of the previous year. She has experienced three outbreaks since September with the most recent one being last week when she had a fever. Identified triggers include stress, illness, and sun exposure. She uses Valtrex 2000 mg twice a day for one day during outbreaks, which she has taken in September, December, and two weeks ago.  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   No significant increase in stressors recently, except for her son's wedding in September, which she acknowledges as a stressor. No other specific stressors contributing to her recent outbreaks.      Past Medical History:  Diagnosis Date    Allergic rhinitis    Asthma    Cervical polyp 04/07/2016   Depression    GERD (gastroesophageal reflux disease)    History of chicken pox     Past Surgical History:  Procedure Laterality Date   CHOLECYSTECTOMY  08/2011   Dr Excell Seltzer   NASAL SINUS SURGERY  2007    Family History  Problem Relation Age of Onset   Hyperlipidemia Mother    Rheum arthritis Father    Arthritis Father    Asthma Brother    Hyperlipidemia Brother    Depression Maternal Aunt        Bi-polar   Cancer Maternal Uncle        Lung Ca - 70's   Diabetes Paternal Aunt    Diabetes Paternal Uncle    Stroke Maternal Grandmother    Cancer Maternal Grandfather        Colon & prostate - 70's   Colon cancer Maternal Grandfather    Diabetes Paternal Grandmother    Breast cancer Neg Hx     Social History   Socioeconomic History   Marital status: Married    Spouse name: Not on file   Number of children: 2   Years of education: Not on file   Highest education level: Not on file  Occupational History   Occupation: Government social research officer  Tobacco Use   Smoking status: Never   Smokeless tobacco: Never  Substance and Sexual Activity   Alcohol use: Not Currently    Comment: Occasional  Drug use: No   Sexual activity: Yes    Birth control/protection: Surgical    Comment: Partner had vasectomy   Other Topics Concern   Not on file  Social History Narrative   Lives in [place] with husband and 2 children. Works at [employer].      Regular Exercise -  Walk 2 to 3 times a week, 1.5 miles   Daily Caffeine Use:  Diet coke 2 to 3 16oz bottles                Social Drivers of Corporate investment banker Strain: Not on file  Food Insecurity: Not on file  Transportation Needs: Not on file  Physical Activity: Not on file  Stress: Not on file  Social Connections: Not on file  Intimate Partner Violence: Not on file    Outpatient Medications Prior to Visit  Medication Sig Dispense Refill   albuterol  (PROAIR HFA) 108 (90 Base) MCG/ACT inhaler TAKE 2 PUFFS BY MOUTH EVERY 6 HOURS AS NEEDED 8.5 g 1   colestipol (COLESTID) 1 g tablet TAKE 2 TABLETS (2 G TOTAL) BY MOUTH DAILY. 180 tablet 1   No facility-administered medications prior to visit.    No Known Allergies  ROS See HPI    Objective:    Physical Exam  Ht 5\' 4"  (1.626 m)   Wt 148 lb (67.1 kg)   LMP 12/19/2016   BMI 25.40 kg/m  Wt Readings from Last 3 Encounters:  02/08/24 148 lb (67.1 kg)  11/25/22 150 lb 3.2 oz (68.1 kg)  06/18/22 147 lb (66.7 kg)   GENERAL: alert, oriented, appears well and in no acute distress   HEENT: atraumatic, conjunttiva clear, no obvious abnormalities on inspection of external nose and ears   NECK: normal movements of the head and neck   LUNGS: on inspection no signs of respiratory distress, breathing rate appears normal, no obvious gross SOB, gasping or wheezing   CV: no obvious cyanosis   MS: moves all visible extremities without noticeable abnormality   PSYCH/NEURO: pleasant and cooperative, no obvious depression or anxiety, speech and thought processing grossly intact    Assessment & Plan:   Problem List Items Addressed This Visit       Digestive   Cold sore - Primary   Outbreaks have increased in frequency since the shingles vaccine a year ago. Suppression treatment with Valtrex is recommended to prevent future outbreaks, particularly with upcoming IPL treatments for ocular rosacea. Start Valtrex 500mg  daily for suppression/prophylaxis. Provide Valtrex 1000mg  for breakthrough sores, use at symptom onset, taking 2 tablets twice a day for one day. Reassess the need for continued prophylactic treatment in the future.       Relevant Medications   valACYclovir (VALTREX) 500 MG tablet   valACYclovir (VALTREX) 1000 MG tablet    I am having Naviah B. Wulff start on valACYclovir and valACYclovir. I am also having her maintain her albuterol and colestipol.  Meds ordered this encounter   Medications   valACYclovir (VALTREX) 500 MG tablet    Sig: Take 1 tablet (500 mg total) by mouth daily.    Dispense:  90 tablet    Refill:  1    Supervising Provider:   Birdie Sons, ERIC G [[id number]]   valACYclovir (VALTREX) 1000 MG tablet    Sig: Take 2 tablets (2,000 mg total) by mouth 2 (two) times daily. X 1 day. Start ASAP at symptom onset. Use for breakthrough only.    Dispense:  20  tablet    Refill:  0    Supervising Provider:   Birdie Sons, ERIC G [[id number]]   I discussed the assessment and treatment plan with the patient. The patient was provided an opportunity to ask questions and all were answered. The patient agreed with the plan and demonstrated an understanding of the instructions.   The patient was advised to call back or seek an in-person evaluation if the symptoms worsen or if the condition fails to improve as anticipated.   Bethanie Dicker, NP [facility] at [facility] [phone] (phone) [phone] (fax)  [facility]

## 2024-02-08 NOTE — Assessment & Plan Note (Signed)
 Outbreaks have increased in frequency since the shingles vaccine a year ago. Suppression treatment with Valtrex is recommended to prevent future outbreaks, particularly with upcoming IPL treatments for ocular rosacea. Start Valtrex 500mg  daily for suppression/prophylaxis. Provide Valtrex 1000mg  for breakthrough sores, use at symptom onset, taking 2 tablets twice a day for one day. Reassess the need for continued prophylactic treatment in the future.

## 2024-03-27 ENCOUNTER — Ambulatory Visit: Payer: 59 | Admitting: Nurse Practitioner

## 2024-03-27 VITALS — BP 112/76 | HR 80 | Temp 97.9°F | Ht 64.0 in | Wt 151.8 lb

## 2024-03-27 DIAGNOSIS — E781 Pure hyperglyceridemia: Secondary | ICD-10-CM | POA: Diagnosis not present

## 2024-03-27 DIAGNOSIS — Z1329 Encounter for screening for other suspected endocrine disorder: Secondary | ICD-10-CM

## 2024-03-27 DIAGNOSIS — Z0001 Encounter for general adult medical examination with abnormal findings: Secondary | ICD-10-CM

## 2024-03-27 DIAGNOSIS — E559 Vitamin D deficiency, unspecified: Secondary | ICD-10-CM

## 2024-03-27 DIAGNOSIS — E663 Overweight: Secondary | ICD-10-CM

## 2024-03-27 DIAGNOSIS — F32A Depression, unspecified: Secondary | ICD-10-CM

## 2024-03-27 DIAGNOSIS — F419 Anxiety disorder, unspecified: Secondary | ICD-10-CM

## 2024-03-27 DIAGNOSIS — B001 Herpesviral vesicular dermatitis: Secondary | ICD-10-CM

## 2024-03-27 DIAGNOSIS — H9319 Tinnitus, unspecified ear: Secondary | ICD-10-CM

## 2024-03-27 LAB — CBC WITH DIFFERENTIAL/PLATELET
Basophils Absolute: 0.1 10*3/uL (ref 0.0–0.1)
Basophils Relative: 1.2 % (ref 0.0–3.0)
Eosinophils Absolute: 0.8 10*3/uL — ABNORMAL HIGH (ref 0.0–0.7)
Eosinophils Relative: 9.2 % — ABNORMAL HIGH (ref 0.0–5.0)
HCT: 39.3 % (ref 36.0–46.0)
Hemoglobin: 13.5 g/dL (ref 12.0–15.0)
Lymphocytes Relative: 31.3 % (ref 12.0–46.0)
Lymphs Abs: 2.7 10*3/uL (ref 0.7–4.0)
MCHC: 34.3 g/dL (ref 30.0–36.0)
MCV: 90.9 fl (ref 78.0–100.0)
Monocytes Absolute: 0.6 10*3/uL (ref 0.1–1.0)
Monocytes Relative: 7 % (ref 3.0–12.0)
Neutro Abs: 4.5 10*3/uL (ref 1.4–7.7)
Neutrophils Relative %: 51.3 % (ref 43.0–77.0)
Platelets: 256 10*3/uL (ref 150.0–400.0)
RBC: 4.33 Mil/uL (ref 3.87–5.11)
RDW: 12.7 % (ref 11.5–15.5)
WBC: 8.7 10*3/uL (ref 4.0–10.5)

## 2024-03-27 LAB — COMPREHENSIVE METABOLIC PANEL WITH GFR
ALT: 22 U/L (ref 0–35)
AST: 20 U/L (ref 0–37)
Albumin: 4.5 g/dL (ref 3.5–5.2)
Alkaline Phosphatase: 90 U/L (ref 39–117)
BUN: 9 mg/dL (ref 6–23)
CO2: 27 meq/L (ref 19–32)
Calcium: 8.9 mg/dL (ref 8.4–10.5)
Chloride: 101 meq/L (ref 96–112)
Creatinine, Ser: 0.72 mg/dL (ref 0.40–1.20)
GFR: 93.87 mL/min (ref >60.00)
Glucose, Bld: 94 mg/dL (ref 70–99)
Potassium: 3.7 meq/L (ref 3.5–5.1)
Sodium: 136 meq/L (ref 135–145)
Total Bilirubin: 0.6 mg/dL (ref 0.2–1.2)
Total Protein: 7.1 g/dL (ref 6.0–8.3)

## 2024-03-27 LAB — LIPID PANEL
Cholesterol: 208 mg/dL — ABNORMAL HIGH (ref 0–200)
HDL: 49.3 mg/dL (ref >39.00)
LDL Cholesterol: 108 mg/dL — ABNORMAL HIGH (ref 0–99)
NonHDL: 158.69
Total CHOL/HDL Ratio: 4
Triglycerides: 253 mg/dL — ABNORMAL HIGH (ref 0.0–149.0)
VLDL: 50.6 mg/dL — ABNORMAL HIGH (ref 0.0–40.0)

## 2024-03-27 LAB — VITAMIN B12: Vitamin B-12: >1537 pg/mL — ABNORMAL HIGH (ref 211–911)

## 2024-03-27 LAB — VITAMIN D 25 HYDROXY (VIT D DEFICIENCY, FRACTURES): VITD: 43.2 ng/mL (ref 30.00–100.00)

## 2024-03-27 LAB — TSH: TSH: 1.17 u[IU]/mL (ref 0.35–5.50)

## 2024-03-27 MED ORDER — SERTRALINE HCL 25 MG PO TABS: 25.0000 mg | ORAL_TABLET | Freq: Every day | ORAL | 0 refills | Status: DC

## 2024-03-27 NOTE — Progress Notes (Signed)
 Bluford Burkitt, NP-C Phone: [phone]  Tanya Kramer is a 58 y.o. female who presents today for transfer of care.   Discussed the use of AI scribe software for clinical note transcription with the patient, who gave verbal consent to proceed.  History of Present Illness   Tanya Kramer is a 58 year old female who presents for transfer of care and annual exam with ringing in the ears and depressive symptoms.   She has been experiencing a constant ringing in her ears, described as a 'TV buzz,' for the past six months. The sound is more noticeable when she is still and there is no background noise. There is no associated dizziness or headaches.  She has been feeling depressed for the past seven to eight months, with symptoms including decreased interest in activities, decreased motivation, and increased anxiety in social situations such as family gatherings. She has a history of depression treated with Zoloft over twenty years ago but is hesitant to resume medication.  She is struggling with weight management despite walking 30 to 45 minutes three to four times a week and adhering to a keto diet with intermittent fasting. She has a history of significant weight loss on the keto diet but has been unable to lose weight since Christmas. She does not eat breakfast due to not having a gallbladder and concerns about diarrhea at work.  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 No chest pain, shortness of breath, abdominal pain, or trouble using the bathroom aside from the diarrhea. She takes melatonin to aid sleep and reports no issues with sleep quality.   She is up to date with her pap smear, with her last being in 2022. She had a mammogram in February and a colonoscopy last in 2023. She has completed the shingles vaccine series and last received a tetanus booster in 2018. She does not smoke, drink alcohol or use illicit drugs. She  see an eye doctor and dentist regularly.      Social History   Tobacco Use  Smoking Status Never  Smokeless Tobacco Never    Current Outpatient Medications on File Prior to Visit  Medication Sig Dispense Refill   albuterol (PROAIR HFA) 108 (90 Base) MCG/ACT inhaler TAKE 2 PUFFS BY MOUTH EVERY 6 HOURS AS NEEDED 8.5 g 1   colestipol (COLESTID) 1 g tablet TAKE 2 TABLETS (2 G TOTAL) BY MOUTH DAILY. 180 tablet 1   valACYclovir (VALTREX) 1000 MG tablet Take 2 tablets (2,000 mg total) by mouth 2 (two) times daily. X 1 day. Start ASAP at symptom onset. Use for breakthrough only. 20 tablet 0   valACYclovir (VALTREX) 500 MG tablet Take 1 tablet (500 mg total) by mouth daily. 90 tablet 1   No current facility-administered medications on file prior to visit.     ROS see history of present illness  Objective  Physical Exam Vitals:   04/03/24 1701  BP: 112/76  Pulse: 80  Temp: 97.9 F (36.6 C)  SpO2: 95%    BP Readings from Last 3 Encounters:  04/03/24 112/76  10/16/23 125/73  11/25/22 113/76   Wt Readings from Last 3 Encounters:  04/03/24 151 lb 12.8 oz (68.9 kg)  02/08/24 148 lb (67.1 kg)  11/25/22 150 lb 3.2 oz (68.1 kg)    Physical Exam Constitutional:      General: She is not in acute distress.    Appearance: Normal appearance.  HENT:  Head: Normocephalic.     Right Ear: Tympanic membrane normal.     Left Ear: Tympanic membrane normal.     Nose: Nose normal.     Mouth/Throat:     Mouth: Mucous membranes are moist.     Pharynx: Oropharynx is clear.  Eyes:     Conjunctiva/sclera: Conjunctivae normal.     Pupils: Pupils are equal, round, and reactive to light.  Neck:     Thyroid: No thyromegaly.  Cardiovascular:     Rate and Rhythm: Normal rate and regular rhythm.     Heart sounds: Normal heart sounds.  Pulmonary:     Effort: Pulmonary effort is normal.     Breath sounds: Normal breath sounds.  Abdominal:     General: Abdomen is flat. Bowel sounds are  normal.     Palpations: Abdomen is soft. There is no mass.     Tenderness: There is no abdominal tenderness.  Musculoskeletal:        General: Normal range of motion.  Lymphadenopathy:     Cervical: No cervical adenopathy.  Skin:    General: Skin is warm and dry.     Findings: No rash.  Neurological:     General: No focal deficit present.     Mental Status: She is alert.  Psychiatric:        Mood and Affect: Mood normal.        Behavior: Behavior normal.     Assessment/Plan: Please see individual problem list.  Encounter for routine adult health examination with abnormal findings Assessment & Plan: Physical exam complete. We will check lab work as outlined and contact patient with results. Pap smear, colonoscopy and mammogram are all up to date. Declines flu vaccine and additional COVID vaccines. Shingles vaccine series is complete and tetanus vaccine is up to date. Continue routine dental and eye exams. Encourage to continue healthy diet and increase regular exercise. Return to care in 6 weeks.    Anxiety and depression Assessment & Plan: Depression and anxiety symptoms have re-emerged over the past 7-8 months. PHQ- 9 and GAD- 5 today. Zoloft is preferred due to its past efficacy. Informed consent for Zoloft initiation has been discussed. Start Zoloft 25 mg daily and reassess in six weeks. Counseled patient on common side effects. Encouraged to contact if worsening symptoms, unusual behavior changes or suicidal thoughts occur.   Orders: -     Sertraline HCl; Take 1 tablet (25 mg total) by mouth at bedtime.  Dispense: 90 tablet; Refill: 0  Tinnitus, unspecified laterality Assessment & Plan: She has experienced bilateral tinnitus for six months. The differential includes vitamin deficiency, ears appear normal on exam. Order blood tests for vitamin deficiencies, including vitamin D and B12. Consider an ENT or audiology referral if blood tests are inconclusive.  Orders: -     CBC  with Differential/Platelet -     Vitamin B12 -     IBC + Ferritin  Overweight (BMI 25.0-29.9) Assessment & Plan: She is having difficulty losing weight despite following a keto diet and intermittent fasting. BMI 26 today. Possible contributing factors include age, mood, and post-menopausal metabolic changes. Encourage increased protein intake and a caloric deficit. Advise more exercise beyond walking.    Hypertriglyceridemia Assessment & Plan: Chronic issue. Managed with diet control. Not on medications. We will check lipid panel today. Encourage to continue healthy diet and regular exercise. The 10-year ASCVD risk score (Arnett DK, et al., 2019) is: 1.8%.   Orders: -  Comprehensive metabolic panel with GFR -     Lipid panel  Vitamin D deficiency -     VITAMIN D 25 Hydroxy (Vit-D Deficiency, Fractures)  Thyroid disorder screen -     TSH    Return in about 6 weeks (around 05/08/2024) for Anxiety/Depression.   Bluford Burkitt, NP-C [facility]

## 2024-03-28 LAB — IBC + FERRITIN
Ferritin: 137.7 ng/mL (ref 10.0–291.0)
Iron: 70 ug/dL (ref 42–145)
Saturation Ratios: 20.5 % (ref 20.0–50.0)
TIBC: 341.6 ug/dL (ref 250.0–450.0)
Transferrin: 244 mg/dL (ref 212.0–360.0)

## 2024-04-03 ENCOUNTER — Encounter: Payer: Self-pay | Admitting: Nurse Practitioner

## 2024-04-03 DIAGNOSIS — E663 Overweight: Secondary | ICD-10-CM | POA: Insufficient documentation

## 2024-04-03 NOTE — Assessment & Plan Note (Signed)
 She has experienced bilateral tinnitus for six months. The differential includes vitamin deficiency, ears appear normal on exam. Order blood tests for vitamin deficiencies, including vitamin D and B12. Consider an ENT or audiology referral if blood tests are inconclusive.

## 2024-04-03 NOTE — Assessment & Plan Note (Signed)
 Depression and anxiety symptoms have re-emerged over the past 7-8 months. PHQ- 9 and GAD- 5 today. Zoloft is preferred due to its past efficacy. Informed consent for Zoloft initiation has been discussed. Start Zoloft 25 mg daily and reassess in six weeks. Counseled patient on common side effects. Encouraged to contact if worsening symptoms, unusual behavior changes or suicidal thoughts occur.

## 2024-04-03 NOTE — Assessment & Plan Note (Signed)
 Physical exam complete. We will check lab work as outlined and contact patient with results. Pap smear, colonoscopy and mammogram are all up to date. Declines flu vaccine and additional COVID vaccines. Shingles vaccine series is complete and tetanus vaccine is up to date. Continue routine dental and eye exams. Encourage to continue healthy diet and increase regular exercise. Return to care in 6 weeks.

## 2024-04-03 NOTE — Assessment & Plan Note (Signed)
 Chronic issue. Managed with diet control. Not on medications. We will check lipid panel today. Encourage to continue healthy diet and regular exercise. The 10-year ASCVD risk score (Arnett DK, et al., 2019) is: 1.8%.

## 2024-04-03 NOTE — Assessment & Plan Note (Addendum)
 She is having difficulty losing weight despite following a keto diet and intermittent fasting. BMI 26 today. Possible contributing factors include age, mood, and post-menopausal metabolic changes. Encourage increased protein intake and a caloric deficit. Advise more exercise beyond walking.

## 2024-05-09 ENCOUNTER — Encounter: Payer: Self-pay | Admitting: Nurse Practitioner

## 2024-05-09 ENCOUNTER — Ambulatory Visit (INDEPENDENT_AMBULATORY_CARE_PROVIDER_SITE_OTHER): Admitting: Nurse Practitioner

## 2024-05-09 VITALS — BP 102/68 | HR 87 | Temp 98.3°F | Ht 64.0 in | Wt 148.4 lb

## 2024-05-09 DIAGNOSIS — J4520 Mild intermittent asthma, uncomplicated: Secondary | ICD-10-CM | POA: Diagnosis not present

## 2024-05-09 DIAGNOSIS — F419 Anxiety disorder, unspecified: Secondary | ICD-10-CM

## 2024-05-09 DIAGNOSIS — F32A Depression, unspecified: Secondary | ICD-10-CM

## 2024-05-09 MED ORDER — HYDROXYZINE HCL 10 MG PO TABS
10.0000 mg | ORAL_TABLET | Freq: Three times a day (TID) | ORAL | 1 refills | Status: AC | PRN
Start: 2024-05-09 — End: ?

## 2024-05-09 MED ORDER — ALBUTEROL SULFATE HFA 108 (90 BASE) MCG/ACT IN AERS: 1.0000 | INHALATION_SPRAY | Freq: Four times a day (QID) | RESPIRATORY_TRACT | 2 refills | Status: AC | PRN

## 2024-05-09 MED ORDER — SERTRALINE HCL 25 MG PO TABS: 25.0000 mg | ORAL_TABLET | Freq: Every day | ORAL | 2 refills | Status: AC

## 2024-05-09 NOTE — Progress Notes (Signed)
 Bluford Burkitt, NP-C Phone: [phone]  Tanya Kramer is a 58 y.o. female who presents today for follow up.   Discussed the use of AI scribe software for clinical note transcription with the patient, who gave verbal consent to proceed.  History of Present Illness   Tanya Kramer is a 58 year old female who presents for a six-week follow-up after starting Zoloft .  She feels better overall since starting Zoloft , though she is unsure if this improvement is due to the medication, the weather, or a combination of both. There is still some room for improvement, particularly regarding anxiety related to specific events.  She experiences significant anxiety leading up to events such as weddings, rehearsal dinners, and graduations, feeling 'like I'm coming unglued' before these events, which results in irritability and stress. However, once she arrives at the event, her anxiety subsides. This anxiety does not occur with everyday activities such as going to the store.  She is currently taking Zoloft  25 mg and feels that it is helping her manage her symptoms. Engaging in outdoor activities like yard work has also been beneficial.  Regarding her sleep, she sometimes wakes up with something on her mind and is unable to return to sleep, a pattern she attributes to menopause. She has experienced this for years.  She requests a refill for her albuterol  inhaler, which she uses infrequently, primarily as a precaution when around cats. No shortness of breath or cough. She denies experiencing anxiety on a daily basis, except in anticipation of specific events.      Social History   Tobacco Use  Smoking Status Never  Smokeless Tobacco Never    Current Outpatient Medications on File Prior to Visit  Medication Sig Dispense Refill   colestipol  (COLESTID ) 1 g tablet TAKE 2 TABLETS (2 G TOTAL) BY MOUTH DAILY. 180 tablet 1   valACYclovir  (VALTREX ) 1000 MG tablet Take 2 tablets (2,000 mg total) by mouth 2  (two) times daily. X 1 day. Start ASAP at symptom onset. Use for breakthrough only. 20 tablet 0   valACYclovir  (VALTREX ) 500 MG tablet Take 1 tablet (500 mg total) by mouth daily. 90 tablet 1   No current facility-administered medications on file prior to visit.     ROS see history of present illness  Objective  Physical Exam Vitals:   05/09/24 1433  BP: 102/68  Pulse: 87  Temp: 98.3 F (36.8 C)  SpO2: 94%    BP Readings from Last 3 Encounters:  05/09/24 102/68  04/03/24 112/76  10/16/23 125/73   Wt Readings from Last 3 Encounters:  05/09/24 148 lb 6.4 oz (67.3 kg)  04/03/24 151 lb 12.8 oz (68.9 kg)  02/08/24 148 lb (67.1 kg)    Physical Exam Constitutional:      General: She is not in acute distress.    Appearance: Normal appearance.  HENT:     Head: Normocephalic.  Cardiovascular:     Rate and Rhythm: Normal rate and regular rhythm.     Heart sounds: Normal heart sounds.  Pulmonary:     Effort: Pulmonary effort is normal.     Breath sounds: Normal breath sounds.  Skin:    General: Skin is warm and dry.  Neurological:     General: No focal deficit present.     Mental Status: She is alert.  Psychiatric:        Mood and Affect: Mood normal.        Behavior: Behavior normal.     Assessment/Plan:  Please see individual problem list.  Anxiety and depression Assessment & Plan: Situational anxiety persists despite Zoloft . Prescribe hydroxyzine for situational management, as needed up to three times daily, with caution for drowsiness. Depression symptoms have improved. Continue Zoloft  at 25 mg daily. Counseled patient on common side effects. Encouraged to contact if worsening symptoms, unusual behavior changes or suicidal thoughts occur. PHQ- 7 and GAD- 5 today.   Orders: -     hydrOXYzine HCl; Take 1-2 tablets (10-20 mg total) by mouth every 8 (eight) hours as needed for anxiety.  Dispense: 60 tablet; Refill: 1 -     Sertraline  HCl; Take 1 tablet (25 mg total)  by mouth at bedtime.  Dispense: 90 tablet; Refill: 2  Mild intermittent asthma without complication Assessment & Plan: Asthma is well-controlled with infrequent albuterol  use, but the inhaler is outdated. Refill the albuterol  inhaler prescription.   Orders: -     Albuterol  Sulfate HFA; Inhale 1-2 puffs into the lungs every 6 (six) hours as needed for wheezing or shortness of breath.  Dispense: 8.5 g; Refill: 2     Return in about 11 months (around 03/28/2025), or if symptoms worsen or fail to improve, for Annual Exam.   Bluford Burkitt, NP-C [facility] [employer]

## 2024-05-09 NOTE — Assessment & Plan Note (Signed)
 Asthma is well-controlled with infrequent albuterol  use, but the inhaler is outdated. Refill the albuterol  inhaler prescription.

## 2024-05-09 NOTE — Assessment & Plan Note (Signed)
 Situational anxiety persists despite Zoloft . Prescribe hydroxyzine for situational management, as needed up to three times daily, with caution for drowsiness. Depression symptoms have improved. Continue Zoloft  at 25 mg daily. Counseled patient on common side effects. Encouraged to contact if worsening symptoms, unusual behavior changes or suicidal thoughts occur. PHQ- 7 and GAD- 5 today.

## 2024-05-14 ENCOUNTER — Other Ambulatory Visit: Payer: Self-pay

## 2024-05-14 DIAGNOSIS — R197 Diarrhea, unspecified: Secondary | ICD-10-CM

## 2024-05-14 MED ORDER — COLESTIPOL HCL 1 G PO TABS: 2.0000 g | ORAL_TABLET | Freq: Every day | ORAL | 1 refills | Status: AC

## 2024-08-17 ENCOUNTER — Other Ambulatory Visit: Payer: Self-pay | Admitting: Nurse Practitioner

## 2024-08-17 DIAGNOSIS — B001 Herpesviral vesicular dermatitis: Secondary | ICD-10-CM

## 2024-12-05 ENCOUNTER — Ambulatory Visit: Admitting: Family Medicine

## 2024-12-05 ENCOUNTER — Encounter: Payer: Self-pay | Admitting: Family Medicine

## 2024-12-05 ENCOUNTER — Ambulatory Visit: Admission: RE | Admit: 2024-12-05 | Source: Home / Self Care

## 2024-12-05 ENCOUNTER — Ambulatory Visit: Admission: RE | Admit: 2024-12-05 | Source: Ambulatory Visit

## 2024-12-05 VITALS — BP 122/70 | HR 97 | Resp 16 | Ht 64.0 in | Wt 151.0 lb

## 2024-12-05 DIAGNOSIS — R0602 Shortness of breath: Secondary | ICD-10-CM

## 2024-12-05 DIAGNOSIS — J029 Acute pharyngitis, unspecified: Secondary | ICD-10-CM

## 2024-12-05 DIAGNOSIS — R051 Acute cough: Secondary | ICD-10-CM

## 2024-12-05 DIAGNOSIS — J45901 Unspecified asthma with (acute) exacerbation: Secondary | ICD-10-CM

## 2024-12-05 DIAGNOSIS — J0100 Acute maxillary sinusitis, unspecified: Secondary | ICD-10-CM

## 2024-12-05 LAB — POC COVID19/FLU A&B COMBO
Covid Antigen, POC: NEGATIVE
Influenza A Antigen, POC: NEGATIVE
Influenza B Antigen, POC: NEGATIVE

## 2024-12-05 LAB — POCT RAPID STREP A (OFFICE): Rapid Strep A Screen: NEGATIVE

## 2024-12-05 MED ORDER — AMOXICILLIN-POT CLAVULANATE 875-125 MG PO TABS: 1.0000 | ORAL_TABLET | Freq: Two times a day (BID) | ORAL | 0 refills | Status: AC

## 2024-12-05 MED ORDER — METHYLPREDNISOLONE 4 MG PO TBPK: ORAL_TABLET | ORAL | 0 refills | Status: AC

## 2024-12-05 MED ORDER — FLUCONAZOLE 150 MG PO TABS: ORAL_TABLET | ORAL | 0 refills | Status: AC

## 2024-12-05 MED ORDER — BENZONATATE 200 MG PO CAPS: 200.0000 mg | ORAL_CAPSULE | Freq: Two times a day (BID) | ORAL | 0 refills | Status: AC | PRN

## 2024-12-05 NOTE — Progress Notes (Signed)
 Acute Office Visit  Subjective:     Patient ID: Tanya Kramer, female    DOB: 11-10-1966, 58 y.o.   MRN: [id number]  Chief Complaint  Patient presents with   Sinusitis    X1 week   Cough    Patient is in today for complaints of sinus congestion, sinus pressure, cough, sore throat and hoarseness. She states symptoms started one week ago starting as a sore throat and progressed over the last one week. She describes cough as productive first thing in the morning and becomes less productive over the course of the day. She denies known fever and denies chills. She describes feelings of chest tightness. She confirms asthma diagnosis and voices due to tightness and shortness of breath she has required use of Albuterol  at least once daily over the last one week, but often multiple times daily.   Review of Systems  Constitutional:  Negative for chills and fever.  HENT:  Positive for congestion, sinus pain and sore throat. Negative for ear pain.   Respiratory:  Positive for cough and shortness of breath.   Gastrointestinal:  Negative for diarrhea, nausea and vomiting.  Musculoskeletal:  Negative for myalgias.        Objective:    BP 122/70   Pulse 97   Resp 16   Ht 5' 4 (1.626 m)   Wt 151 lb (68.5 kg)   LMP 12/19/2016   SpO2 99%   BMI 25.92 kg/m    Physical Exam Constitutional:      Appearance: Normal appearance. She is not toxic-appearing.  HENT:     Head: Normocephalic and atraumatic.     Right Ear: Tympanic membrane and ear canal normal.     Left Ear: Tympanic membrane and ear canal normal.     Nose:     Right Sinus: Maxillary sinus tenderness present.     Left Sinus: Maxillary sinus tenderness present.     Mouth/Throat:     Pharynx: Uvula midline. Postnasal drip present. No oropharyngeal exudate.  Cardiovascular:     Rate and Rhythm: Normal rate and regular rhythm.     Heart sounds: Normal heart sounds.  Pulmonary:     Effort: Pulmonary effort is normal.      Breath sounds: Decreased air movement present. Wheezing present.  Lymphadenopathy:     Head:     Right side of head: Submandibular adenopathy present.     Left side of head: Submandibular adenopathy present.     Cervical: No cervical adenopathy.  Skin:    General: Skin is warm and dry.  Neurological:     General: No focal deficit present.     Mental Status: She is alert.  Psychiatric:        Mood and Affect: Mood normal.        Behavior: Behavior normal.     Results for orders placed or performed in visit on 12/05/24  POC Covid19/Flu A&B Antigen  Result Value Ref Range   Influenza A Antigen, POC Negative Negative   Influenza B Antigen, POC Negative Negative   Covid Antigen, POC Negative Negative  POCT rapid strep A  Result Value Ref Range   Rapid Strep A Screen Negative Negative        Assessment & Plan:   1. Exacerbation of asthma, unspecified asthma severity, unspecified whether persistent (Primary) Patient with diminished lung fields bilaterally and upper lobe wheezing bilaterally. She complains of shortness of breath requiring use of Albuterol  inhaler once daily, often multiple  times daily, for the last one week. She states usually asthma is well controlled not requiring frequent rescue inhaler use, and not requiring maintenance therapy.   -Will treat asthma exacerbation with Medrol  dose pack. Prescription and instructions provided.  -Return precautions advised. Recommending 4 to 6 week f/u with PCP.   - methylPREDNISolone  (MEDROL  DOSEPAK) 4 MG TBPK tablet; Day 1: Take 8 mg (2 tablets) before breakfast, 4 mg (1 tablet) after lunch, 4 mg (1 tablet) after supper, and 8 mg (2 tablets) at bedtime. Day 2:Take 4 mg (1 tablet) before breakfast, 4 mg (1 tablet) after lunch, 4 mg (1 tablet) after supper, and 8 mg (2 tablets) at bedtime. Day 3: Take 4 mg (1 tablet) before breakfast, 4 mg (1 tablet) after lunch, 4 mg (1 tablet) after supper, and 4 mg (1 tablet) at bedtime. Day 4: Take  4 mg (1 tablet) before breakfast, 4 mg (1 tablet) after lunch, and 4 mg (1 tablet) at bedtime. Day 5: Take 4 mg (1 tablet) before breakfast and 4 mg (1 tablet) at bedtime. Day 6: Take 4 mg (1 tablet) before breakfast.  Dispense: 1 each; Refill: 0  2. Shortness of breath Shortness of breath x 1 week with associated chest tightness. She also endorses complaints of cough, sinus congestion, sinus pain and sore throat.  Rapid COVID testing negative. Rapid strep testing negative.  V/s stable.  As noted above, asthma exacerbation managed with Medrol  dose pack.   -Chest x-ray ordered due to abnormal breath sounds on exam with complaints of shortness of breath and documented asthma diagnosis -Recommended 4 to 6 week f/u with PCP  - POC Covid19/Flu A&B Antigen - POCT rapid strep A - DG Chest 2 View; Future  3. Acute cough Cough x 1 week. She describes cough to be productive, worse in the morning. She also had endorsed associated symptoms including shortness of breath, chest tightness, sore throat and sinus congestion and pain as noted in HPI and ROS.  Rapid COVID testing negative.  -Chest x-ray ordered for further evaluation  -Benzonatate  200mg  PO BID PRN cough, prescription provided.   - POC Covid19/Flu A&B Antigen - DG Chest 2 View; Future - benzonatate  (TESSALON ) 200 MG capsule; Take 1 capsule (200 mg total) by mouth 2 (two) times daily as needed for cough.  Dispense: 20 capsule; Refill: 0  4. Acute non-recurrent maxillary sinusitis Maxillary sinus tenderness present on exam. She endorses sinus congestion and sinus pain for the last one week. She has tried several OTC medications without relief. Sinusitis complicated by asthma diagnosis, treating asthma exacerbation.  -Will treat Maxillary sinusitis with Augmentin  875-125mg  one tablet PO BID with meals -She voices typically she gets a yeast infection with abx treatment. Diflucan  prescription provided to be taken as needed as directed. She  voices understanding.  - amoxicillin -clavulanate (AUGMENTIN ) 875-125 MG tablet; Take 1 tablet by mouth 2 (two) times daily with a meal.  Dispense: 20 tablet; Refill: 0 - fluconazole  (DIFLUCAN ) 150 MG tablet; Take one tablet by mouth at onset of symptoms during antibiotics, and then may take second tablet by mouth as needed after completion of antibiotics  Dispense: 2 tablet; Refill: 0  5. Sore throat Sore throat x 1 week. Rapid strep negative. Maxillary sinusitis being treated with Augmentin  therapy, which would be adequate for strep if additional concern. Due to sore throat x1 week and rapid negative, will defer strep culture.   - POCT rapid strep A    Return if symptoms worsen or fail to  improve.  LAYMON LOISE CORE, FNP

## 2024-12-10 ENCOUNTER — Encounter: Payer: Self-pay | Admitting: Family Medicine

## 2024-12-20 ENCOUNTER — Ambulatory Visit: Payer: Self-pay | Admitting: Family Medicine

## 2025-04-03 ENCOUNTER — Encounter: Admitting: Nurse Practitioner
